# Patient Record
Sex: Male | Born: 1995 | Race: Black or African American | Hispanic: No | Marital: Single | Smoking: Former smoker
Health system: Southern US, Community
[De-identification: ages and names within clinical notes are randomized; demographics above are authoritative.]

## PROBLEM LIST (undated history)

## (undated) DIAGNOSIS — B2790 Infectious mononucleosis, unspecified without complication: Secondary | ICD-10-CM

## (undated) DIAGNOSIS — B20 Human immunodeficiency virus [HIV] disease: Secondary | ICD-10-CM

## (undated) DIAGNOSIS — A539 Syphilis, unspecified: Secondary | ICD-10-CM

## (undated) HISTORY — DX: Human immunodeficiency virus (HIV) disease: B20

## (undated) HISTORY — DX: Syphilis, unspecified: A53.9

---
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
## 2015-01-07 ENCOUNTER — Emergency Department (HOSPITAL_COMMUNITY)
Admission: EM | Admit: 2015-01-07 | Discharge: 2015-01-07 | Disposition: A | Discharge status: Home / Self Care | Payer: Medicaid Other | Attending: Emergency Medicine | Admitting: Emergency Medicine

## 2015-01-07 ENCOUNTER — Encounter (HOSPITAL_COMMUNITY): Payer: Self-pay | Admitting: Emergency Medicine

## 2015-01-07 DIAGNOSIS — H6691 Otitis media, unspecified, right ear: Secondary | ICD-10-CM | POA: Diagnosis not present

## 2015-01-07 DIAGNOSIS — H9191 Unspecified hearing loss, right ear: Secondary | ICD-10-CM | POA: Diagnosis not present

## 2015-01-07 DIAGNOSIS — B20 Human immunodeficiency virus [HIV] disease: Secondary | ICD-10-CM | POA: Diagnosis not present

## 2015-01-07 DIAGNOSIS — Z79899 Other long term (current) drug therapy: Secondary | ICD-10-CM | POA: Insufficient documentation

## 2015-01-07 DIAGNOSIS — Z87891 Personal history of nicotine dependence: Secondary | ICD-10-CM | POA: Diagnosis not present

## 2015-01-07 DIAGNOSIS — H9201 Otalgia, right ear: Secondary | ICD-10-CM | POA: Diagnosis present

## 2015-01-07 MED ORDER — AMOXICILLIN 500 MG PO CAPS: 500.0000 mg | ORAL_CAPSULE | Freq: Three times a day (TID) | ORAL | Status: DC

## 2015-01-07 MED ORDER — IBUPROFEN 800 MG PO TABS: 800.0000 mg | ORAL_TABLET | Freq: Three times a day (TID) | ORAL | Status: DC

## 2015-01-07 NOTE — ED Provider Notes (Signed)
CSN: [id number]     Arrival date & time 01/07/15  1503 History  This chart was scribed for non-physician practitioner, Langston Masker, PA-C, working with Mancel Bale, MD, by Ronney Lion, ED Scribe. This patient was seen in room TR06C/TR06C and the patient's care was started at 4:02 PM .   Chief Complaint  Patient presents with  . Ear Fullness   The history is provided by the patient. No language interpreter was used.    HPI Comments: Seth Taylor is a 19 y.o. male who presents to the Emergency Department complaining of constant, moderate, right otalgia and a sensation of fullness in his right ear. He also complains of associated hearing loss. He denies any recent swimming but states he has been taking a lot of showers lately. Applying pressure to the right side of his head alleviates his symptoms. Patient states he does not have a PCP. Patient has NKDA.  Past Medical History  Diagnosis Date  . Mononucleosis   . Syphilis   . HIV disease    History reviewed. No pertinent past surgical history. Family History  Problem Relation Age of Onset  . CVA Mother     seconday to drug use  . Diabetes Father   . Hepatitis C Father    History  Substance Use Topics  . Smoking status: Former Games developer  . Smokeless tobacco: Never Used  . Alcohol Use: 0.0 oz/week    0 Standard drinks or equivalent per week     Comment: "on occassion"    Review of Systems  HENT: Positive for congestion (sensation of ear congestion), ear pain and hearing loss.   All other systems reviewed and are negative.   Allergies  Review of patient's allergies indicates no known allergies.  Home Medications   Prior to Admission medications   Medication Sig Start Date End Date Taking? Authorizing Provider  elvitegravir-cobicistat-emtricitabine-tenofovir (GENVOYA) 150-150-200-10 MG TABS tablet Take 1 tablet by mouth daily with breakfast. 07/08/14  Yes Ginnie Smart, MD  ibuprofen (ADVIL,MOTRIN) 200 MG tablet Take 200 mg  by mouth every 6 (six) hours as needed for mild pain or moderate pain.   Yes Historical Provider, MD   BP 135/78 mmHg  Pulse 75  Temp(Src) 98.9 F (37.2 C) (Oral)  Resp 18  Ht  (1.803 m)  Wt 252 lb 6 oz (114.477 kg)  BMI 35.21 kg/m2  SpO2 97% Physical Exam  Constitutional: He is oriented to person, place, and time. He appears well-developed and well-nourished. No distress.  HENT:  Head: Normocephalic and atraumatic.  Right Ear: External ear and ear canal normal. Tympanic membrane is erythematous.  Left Ear: Hearing, tympanic membrane, external ear and ear canal normal.  Right TM is darkened and erythematous. Ear canal is normal.   Eyes: Conjunctivae and EOM are normal.  Neck: Neck supple. No tracheal deviation present.  Cardiovascular: Normal rate.   Pulmonary/Chest: Effort normal. No respiratory distress.  Musculoskeletal: Normal range of motion.  Neurological: He is alert and oriented to person, place, and time.  Skin: Skin is warm and dry.  Psychiatric: He has a normal mood and affect. His behavior is normal.  Nursing note and vitals reviewed.   ED Course  Procedures (including critical care time)  DIAGNOSTIC STUDIES: Oxygen Saturation is 97% on RA, normal by my interpretation.    COORDINATION OF CARE: 4:03 PM - Suspect otitis media. Discussed treatment plan with pt at bedside which includes Rx antibiotics for 1 week, and ibuprofen prn for pain. Advised  pt to return to ED in 1 week for persistent or worsening symptoms. Pt verbalized understanding and agreed to plan.   MDM   Final diagnoses:  Acute right otitis media, recurrence not specified, unspecified otitis media type    Meds ordered this encounter  Medications  . DISCONTD: ibuprofen (ADVIL,MOTRIN) 200 MG tablet    Sig: Take 200 mg by mouth every 6 (six) hours as needed for mild pain or moderate pain.  Marland Kitchen amoxicillin (AMOXIL) 500 MG capsule    Sig: Take 1 capsule (500 mg total) by mouth 3 (three) times  daily.    Dispense:  21 capsule    Refill:  0    Order Specific Question:  Supervising Provider    Answer:  MILLER, BRIAN [[id number]]  . ibuprofen (ADVIL,MOTRIN) 800 MG tablet    Sig: Take 1 tablet (800 mg total) by mouth 3 (three) times daily.    Dispense:  21 tablet    Refill:  0    Order Specific Question:  Supervising Provider    Answer:  Eber Hong [[id number]]    I personally performed the services in this documentation, which was scribed in my presence.  The recorded information has been reviewed and considered.   Barnet Pall.  Lonia Skinner Wilder, PA-C 01/07/15 1651  Mancel Bale, MD 01/07/15 [phone]

## 2015-01-07 NOTE — ED Notes (Signed)
Pt c/o not being able to hear out of right ear.  St's feels like water in it.

## 2015-01-07 NOTE — Discharge Instructions (Signed)

## 2015-01-07 NOTE — ED Notes (Signed)
Pt verbalizes understanding of d/c instructions and denies any further needs at this time. 

## 2015-01-24 ENCOUNTER — Ambulatory Visit: Payer: Self-pay | Admitting: Infectious Diseases

## 2015-01-31 ENCOUNTER — Ambulatory Visit: Payer: Self-pay | Admitting: Infectious Diseases

## 2015-03-15 ENCOUNTER — Ambulatory Visit: Payer: Medicaid Other | Admitting: Infectious Diseases

## 2015-05-11 ENCOUNTER — Encounter: Payer: Self-pay | Admitting: Infectious Diseases

## 2015-05-11 ENCOUNTER — Ambulatory Visit (INDEPENDENT_AMBULATORY_CARE_PROVIDER_SITE_OTHER): Payer: Medicaid Other | Admitting: Infectious Diseases

## 2015-05-11 VITALS — BP 125/73 | HR 68 | Temp 98.0°F | Wt 260.0 lb

## 2015-05-11 DIAGNOSIS — B20 Human immunodeficiency virus [HIV] disease: Secondary | ICD-10-CM

## 2015-05-11 DIAGNOSIS — Z113 Encounter for screening for infections with a predominantly sexual mode of transmission: Secondary | ICD-10-CM | POA: Diagnosis not present

## 2015-05-11 DIAGNOSIS — Z79899 Other long term (current) drug therapy: Secondary | ICD-10-CM | POA: Diagnosis not present

## 2015-05-11 DIAGNOSIS — A539 Syphilis, unspecified: Secondary | ICD-10-CM

## 2015-05-11 DIAGNOSIS — Z23 Encounter for immunization: Secondary | ICD-10-CM | POA: Diagnosis not present

## 2015-05-11 LAB — COMPREHENSIVE METABOLIC PANEL
ALBUMIN: 4.1 g/dL (ref 3.6–5.1)
ALK PHOS: 90 U/L (ref 48–230)
ALT: 17 U/L (ref 8–46)
AST: 20 U/L (ref 12–32)
BILIRUBIN TOTAL: 0.5 mg/dL (ref 0.2–1.1)
BUN: 12 mg/dL (ref 7–20)
CO2: 25 mmol/L (ref 20–31)
Calcium: 9.1 mg/dL (ref 8.9–10.4)
Chloride: 100 mmol/L (ref 98–110)
Creat: 0.94 mg/dL (ref 0.60–1.26)
GLUCOSE: 80 mg/dL (ref 65–99)
POTASSIUM: 3.7 mmol/L — AB (ref 3.8–5.1)
Sodium: 138 mmol/L (ref 135–146)
Total Protein: 7.1 g/dL (ref 6.3–8.2)

## 2015-05-11 LAB — CBC
HEMATOCRIT: 41.6 % (ref 39.0–52.0)
HEMOGLOBIN: 14.9 g/dL (ref 13.0–17.0)
MCH: 28.7 pg (ref 26.0–34.0)
MCHC: 35.8 g/dL (ref 30.0–36.0)
MCV: 80 fL (ref 78.0–100.0)
MPV: 9.8 fL (ref 8.6–12.4)
Platelets: 225 10*3/uL (ref 150–400)
RBC: 5.2 MIL/uL (ref 4.22–5.81)
RDW: 14.8 % (ref 11.5–15.5)
WBC: 6.3 10*3/uL (ref 4.0–10.5)

## 2015-05-11 NOTE — Addendum Note (Signed)
Addended by: Mariea ClontsGREEN, Tajuanna Burnett D on: 05/11/2015 01:59 PM   Modules accepted: Orders

## 2015-05-11 NOTE — Assessment & Plan Note (Signed)
Start HPV series Start Hep A and B Given condoms Will check his labs today Will get him in with housing Get him bus passes See him back in 4 months

## 2015-05-11 NOTE — Assessment & Plan Note (Signed)
Will recheck his RPR 

## 2015-05-11 NOTE — Addendum Note (Signed)
Addended by: Wendall MolaOCKERHAM, JACQUELINE A on: 05/11/2015 11:58 AM   Modules accepted: Orders

## 2015-05-11 NOTE — Progress Notes (Signed)
   Subjective:    Patient ID: Seth Taylor, male    DOB: 05/10/1996, 19 y.o.   MRN: [id number]  HPI 19 yo M with newly dx HIV+. 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 Had first visit 1--16 and was started on genvoya.   He has had issues with adherence since.  Is having difficulty with finances.   HIV 1 RNA QUANT (copies/mL)  Date Value  08/23/2014 <20  05/20/2014 [id number]*   CD4 T CELL ABS (/uL)  Date Value  08/23/2014 560  05/20/2014 380*    Review of Systems  Constitutional: Negative for appetite change and unexpected weight change.  Gastrointestinal: Negative for diarrhea and constipation.  Genitourinary: Negative for difficulty urinating.       Objective:   Physical Exam  Constitutional: He appears well-developed and well-nourished.  HENT:  Mouth/Throat: No oropharyngeal exudate.  Eyes: EOM are normal.  Neck: Neck supple.  Cardiovascular: Normal rate, regular rhythm and normal heart sounds.   Pulmonary/Chest: Effort normal and breath sounds normal.  Abdominal: Soft. Bowel sounds are normal. There is no tenderness. There is no rebound.  Musculoskeletal: He exhibits no edema.  Lymphadenopathy:    He has no cervical adenopathy.       Assessment & Plan:

## 2015-05-12 LAB — RPR TITER: RPR Titer: 1:16 {titer} — AB

## 2015-05-12 LAB — FLUORESCENT TREPONEMAL AB(FTA)-IGG-BLD: Fluorescent Treponemal ABS: REACTIVE — AB

## 2015-05-12 LAB — T-HELPER CELL (CD4) - (RCID CLINIC ONLY)
CD4 % Helper T Cell: 26 % — ABNORMAL LOW (ref 33–55)
CD4 T Cell Abs: 590 /uL (ref 400–2700)

## 2015-05-12 LAB — RPR: RPR Ser Ql: REACTIVE — AB

## 2015-05-16 LAB — HIV-1 RNA ULTRAQUANT REFLEX TO GENTYP+: HIV 1 RNA Quant: <20 copies/mL (ref <20)

## 2015-06-14 ENCOUNTER — Ambulatory Visit (INDEPENDENT_AMBULATORY_CARE_PROVIDER_SITE_OTHER): Payer: Medicaid Other | Admitting: *Deleted

## 2015-06-14 DIAGNOSIS — Z23 Encounter for immunization: Secondary | ICD-10-CM | POA: Diagnosis not present

## 2015-07-12 ENCOUNTER — Ambulatory Visit: Payer: Self-pay

## 2015-07-13 ENCOUNTER — Other Ambulatory Visit: Payer: Self-pay

## 2015-07-20 ENCOUNTER — Other Ambulatory Visit: Payer: Self-pay | Admitting: Infectious Diseases

## 2015-07-20 DIAGNOSIS — Z21 Asymptomatic human immunodeficiency virus [HIV] infection status: Secondary | ICD-10-CM

## 2015-07-20 DIAGNOSIS — B20 Human immunodeficiency virus [HIV] disease: Secondary | ICD-10-CM

## 2015-08-01 ENCOUNTER — Other Ambulatory Visit: Payer: Self-pay

## 2015-08-10 ENCOUNTER — Ambulatory Visit: Payer: Self-pay | Admitting: Infectious Diseases

## 2015-08-16 ENCOUNTER — Emergency Department (HOSPITAL_COMMUNITY): Payer: Medicaid Other

## 2015-08-16 ENCOUNTER — Encounter (HOSPITAL_COMMUNITY): Payer: Self-pay | Admitting: Emergency Medicine

## 2015-08-16 ENCOUNTER — Emergency Department (HOSPITAL_COMMUNITY)
Admission: EM | Admit: 2015-08-16 | Discharge: 2015-08-16 | Disposition: A | Discharge status: Home / Self Care | Payer: Medicaid Other | Attending: Emergency Medicine | Admitting: Emergency Medicine

## 2015-08-16 DIAGNOSIS — M545 Low back pain: Secondary | ICD-10-CM | POA: Insufficient documentation

## 2015-08-16 DIAGNOSIS — Z87891 Personal history of nicotine dependence: Secondary | ICD-10-CM | POA: Diagnosis not present

## 2015-08-16 DIAGNOSIS — J069 Acute upper respiratory infection, unspecified: Secondary | ICD-10-CM | POA: Insufficient documentation

## 2015-08-16 DIAGNOSIS — Z79899 Other long term (current) drug therapy: Secondary | ICD-10-CM | POA: Insufficient documentation

## 2015-08-16 DIAGNOSIS — R05 Cough: Secondary | ICD-10-CM | POA: Diagnosis present

## 2015-08-16 DIAGNOSIS — Z8619 Personal history of other infectious and parasitic diseases: Secondary | ICD-10-CM | POA: Diagnosis not present

## 2015-08-16 DIAGNOSIS — H00016 Hordeolum externum left eye, unspecified eyelid: Secondary | ICD-10-CM | POA: Diagnosis not present

## 2015-08-16 DIAGNOSIS — Z21 Asymptomatic human immunodeficiency virus [HIV] infection status: Secondary | ICD-10-CM | POA: Diagnosis not present

## 2015-08-16 LAB — BASIC METABOLIC PANEL
ANION GAP: 11 (ref 5–15)
BUN: 10 mg/dL (ref 6–20)
CHLORIDE: 100 mmol/L — AB (ref 101–111)
CO2: 26 mmol/L (ref 22–32)
Calcium: 9 mg/dL (ref 8.9–10.3)
Creatinine, Ser: 1.41 mg/dL — ABNORMAL HIGH (ref 0.61–1.24)
GFR calc non Af Amer: >60 mL/min (ref >60)
Glucose, Bld: 91 mg/dL (ref 65–99)
Potassium: 3.3 mmol/L — ABNORMAL LOW (ref 3.5–5.1)
Sodium: 137 mmol/L (ref 135–145)

## 2015-08-16 LAB — CBC
HEMATOCRIT: 45.6 % (ref 39.0–52.0)
HEMOGLOBIN: 15.4 g/dL (ref 13.0–17.0)
MCH: 27.6 pg (ref 26.0–34.0)
MCHC: 33.8 g/dL (ref 30.0–36.0)
MCV: 81.7 fL (ref 78.0–100.0)
Platelets: 195 10*3/uL (ref 150–400)
RBC: 5.58 MIL/uL (ref 4.22–5.81)
RDW: 14.6 % (ref 11.5–15.5)
WBC: 5.7 10*3/uL (ref 4.0–10.5)

## 2015-08-16 LAB — URINE MICROSCOPIC-ADD ON

## 2015-08-16 LAB — URINALYSIS, ROUTINE W REFLEX MICROSCOPIC
Bilirubin Urine: NEGATIVE
Glucose, UA: NEGATIVE mg/dL
Hgb urine dipstick: NEGATIVE
KETONES UR: NEGATIVE mg/dL
LEUKOCYTES UA: NEGATIVE
NITRITE: NEGATIVE
Protein, ur: 30 mg/dL — AB
SPECIFIC GRAVITY, URINE: 1.025 (ref 1.005–1.030)
pH: 5 (ref 5.0–8.0)

## 2015-08-16 LAB — RAPID STREP SCREEN (MED CTR MEBANE ONLY): Streptococcus, Group A Screen (Direct): NEGATIVE

## 2015-08-16 MED ORDER — KETOROLAC TROMETHAMINE 60 MG/2ML IM SOLN
60.0000 mg | Freq: Once | INTRAMUSCULAR | Status: DC
  Filled 2015-08-16: qty 2

## 2015-08-16 MED ORDER — AZITHROMYCIN 250 MG PO TABS: 250.0000 mg | ORAL_TABLET | Freq: Every day | ORAL | Status: DC

## 2015-08-16 NOTE — ED Notes (Signed)
Pt states "I got this stye in my eye for four weeks." Pt also c/o cough, and coughing up phlem. Pt also c/o L sided back pain. States "it hurts in my back when i bend over".

## 2015-08-16 NOTE — ED Notes (Signed)
Declined W/C at D/C and was escorted to lobby by RN. 

## 2015-08-16 NOTE — Discharge Instructions (Signed)
[name],  Nice meeting you! Please follow-up with your primary care provider. Your kidney function was decreased today. You need to follow-up with your primary care provider. Return to the emergency department if you do not improve within a few days. Feel better soon!  S. Lane HackerNicole Jeidi Gilles, PA-C Upper Respiratory Infection, Adult Most upper respiratory infections (URIs) are a viral infection of the air passages leading to the lungs. A URI affects the nose, throat, and upper air passages. The most common type of URI is nasopharyngitis and is typically referred to as "the common cold." URIs run their course and usually go away on their own. Most of the time, a URI does not require medical attention, but sometimes a bacterial infection in the upper airways can follow a viral infection. This is called a secondary infection. Sinus and middle ear infections are common types of secondary upper respiratory infections. Bacterial pneumonia can also complicate a URI. A URI can worsen asthma and chronic obstructive pulmonary disease (COPD). Sometimes, these complications can require emergency medical care and may be life threatening.  CAUSES Almost all URIs are caused by viruses. A virus is a type of germ and can spread from one person to another.  RISKS FACTORS You may be at risk for a URI if:   You smoke.   You have chronic heart or lung disease.  You have a weakened defense (immune) system.   You are very young or very old.   You have nasal allergies or asthma.  You work in crowded or poorly ventilated areas.  You work in health care facilities or schools. SIGNS AND SYMPTOMS  Symptoms typically develop 2-3 days after you come in contact with a cold virus. Most viral URIs last 7-10 days. However, viral URIs from the influenza virus (flu virus) can last 14-18 days and are typically more severe. Symptoms may include:   Runny or stuffy (congested) nose.   Sneezing.   Cough.    Sore throat.   Headache.   Fatigue.   Fever.   Loss of appetite.   Pain in your forehead, behind your eyes, and over your cheekbones (sinus pain).  Muscle aches.  DIAGNOSIS  Your health care provider may diagnose a URI by:  Physical exam.  Tests to check that your symptoms are not due to another condition such as:  Strep throat.  Sinusitis.  Pneumonia.  Asthma. TREATMENT  A URI goes away on its own with time. It cannot be cured with medicines, but medicines may be prescribed or recommended to relieve symptoms. Medicines may help:  Reduce your fever.  Reduce your cough.  Relieve nasal congestion. HOME CARE INSTRUCTIONS   Take medicines only as directed by your health care provider.   Gargle warm saltwater or take cough drops to comfort your throat as directed by your health care provider.  Use a warm mist humidifier or inhale steam from a shower to increase air moisture. This may make it easier to breathe.  Drink enough fluid to keep your urine clear or pale yellow.   Eat soups and other clear broths and maintain good nutrition.   Rest as needed.   Return to work when your temperature has returned to normal or as your health care provider advises. You may need to stay home longer to avoid infecting others. You can also use a face mask and careful hand washing to prevent spread of the virus.  Increase the usage of your inhaler if you have asthma.  Do not use any tobacco products, including cigarettes, chewing tobacco, or electronic cigarettes. If you need help quitting, ask your health care provider. PREVENTION  The best way to protect yourself from getting a cold is to practice good hygiene.   Avoid oral or hand contact with people with cold symptoms.   Wash your hands often if contact occurs.  There is no clear evidence that vitamin C, vitamin E, echinacea, or exercise reduces the chance of developing a cold. However, it is always  recommended to get plenty of rest, exercise, and practice good nutrition.  SEEK MEDICAL CARE IF:   You are getting worse rather than better.   Your symptoms are not controlled by medicine.   You have chills.  You have worsening shortness of breath.  You have brown or red mucus.  You have yellow or brown nasal discharge.  You have pain in your face, especially when you bend forward.  You have a fever.  You have swollen neck glands.  You have pain while swallowing.  You have white areas in the back of your throat. SEEK IMMEDIATE MEDICAL CARE IF:   You have severe or persistent:  Headache.  Ear pain.  Sinus pain.  Chest pain.  You have chronic lung disease and any of the following:  Wheezing.  Prolonged cough.  Coughing up blood.  A change in your usual mucus.  You have a stiff neck.  You have changes in your:  Vision.  Hearing.  Thinking.  Mood. MAKE SURE YOU:   Understand these instructions.  Will watch your condition.  Will get help right away if you are not doing well or get worse.   This information is not intended to replace advice given to you by your health care provider. Make sure you discuss any questions you have with your health care provider.   Document Released: 11/21/2000 Document Revised: 10/12/2014 Document Reviewed: 09/02/2013 Elsevier Interactive Patient Education [employer].

## 2015-08-16 NOTE — ED Provider Notes (Signed)
CSN: [id number]     Arrival date & time 08/16/15  1246 History  By signing my name below, I, Soijett Blue, attest that this documentation has been prepared under the direction and in the presence of S. Lane HackerNicole Emmalou Hunger, PA-C Electronically Signed: Soijett Blue, ED Scribe. 08/16/2015. 2:27 PM.    Chief Complaint  Patient presents with  . Cough  . Back Pain  . Stye      The history is provided by the patient. No language interpreter was used.    Seth Taylor is a 20 y.o. male with a PMHx of Mono, Syphilis, and HIV who presents to the Emergency Department complaining of productive cough onset 3 days. He states that he is having associated symptoms of chills, HA, and left lower back pain. Pt secondarily complains of a stye to his left eye x 1 month. He states that he has tried nyquil with no relief for his symptoms. He denies abdominal pain, difficulty urinating, and any other symptoms. Pt denies any PMHx of UTI or kidney stones.   Past Medical History  Diagnosis Date  . Mononucleosis   . Syphilis   . HIV disease (HCC)    History reviewed. No pertinent past surgical history. Family History  Problem Relation Age of Onset  . CVA Mother     seconday to drug use  . Diabetes Father   . Hepatitis C Father    Social History  Substance Use Topics  . Smoking status: Former Games developermoker  . Smokeless tobacco: Never Used  . Alcohol Use: 0.0 oz/week    0 Standard drinks or equivalent per week     Comment: "on occassion"    Review of Systems  Eyes:       Stye to left eye  Respiratory: Positive for cough.   Gastrointestinal: Negative for abdominal pain.  Genitourinary: Negative for difficulty urinating.  Musculoskeletal: Positive for back pain. Negative for gait problem.      Allergies  Review of patient's allergies indicates no known allergies.  Home Medications   Prior to Admission medications   Medication Sig Start Date End Date Taking? Authorizing Provider  GENVOYA 150-150-200-10  MG TABS tablet TAKE 1 TABLET BY MOUTH DAILY WITH BREAKFAST. 07/20/15   Ginnie SmartJeffrey C Hatcher, MD   BP 123/79 mmHg  Pulse 90  Temp(Src) 99.3 F (37.4 C)  Resp 18  Ht 5\' 11"  (1.803 m)  Wt 265 lb 12.8 oz (120.566 kg)  BMI 37.09 kg/m2  SpO2 96% Physical Exam  Constitutional: He is oriented to person, place, and time. He appears well-developed and well-nourished. No distress.  HENT:  Head: Normocephalic and atraumatic.  Mouth/Throat: Posterior oropharyngeal edema and posterior oropharyngeal erythema present.  Tonsils are 3+ with erythema  Eyes: EOM are normal.  Neck: Neck supple.  Cardiovascular: Normal rate, regular rhythm and normal heart sounds.  Exam reveals no gallop and no friction rub.   No murmur heard. Pulmonary/Chest: Effort normal and breath sounds normal. No respiratory distress. He has no wheezes. He has no rales.  Musculoskeletal: Normal range of motion.  Neurological: He is alert and oriented to person, place, and time.  Skin: Skin is warm and dry.  Psychiatric: He has a normal mood and affect. His behavior is normal.  Nursing note and vitals reviewed.   ED Course  Procedures  DIAGNOSTIC STUDIES: Oxygen Saturation is 96% on RA, nl by my interpretation.    COORDINATION OF CARE: 2:24 PM Discussed treatment plan with pt at bedside which includes CXR, labs,  rapid strep screen, abx, and pt agreed to plan.   Labs Review Labs Reviewed  BASIC METABOLIC PANEL - Abnormal; Notable for the following:    Potassium 3.3 (*)    Chloride 100 (*)    Creatinine, Ser 1.41 (*)    All other components within normal limits  URINALYSIS, ROUTINE W REFLEX MICROSCOPIC (NOT AT [facility]) - Abnormal; Notable for the following:    Color, Urine AMBER (*)    APPearance CLOUDY (*)    Protein, ur 30 (*)    All other components within normal limits  URINE MICROSCOPIC-ADD ON - Abnormal; Notable for the following:    Squamous Epithelial / LPF 0-5 (*)    Bacteria, UA FEW (*)    Casts GRANULAR CAST (*)     All other components within normal limits  RAPID STREP SCREEN (NOT AT [facility])  CULTURE, GROUP A STREP [facility])  CBC    Imaging Review Dg Chest 2 View  08/16/2015  CLINICAL DATA:  20 year old with cough and fever for 3 or 4 days. Smoker. EXAM: CHEST  2 VIEW COMPARISON:  07/07/2013 and 06/21/2013. FINDINGS: The heart size and mediastinal contours are normal. The lungs are clear. There is no pleural effusion or pneumothorax. No acute osseous findings are identified. IMPRESSION: Stable chest.  No active cardiopulmonary process. Electronically Signed   By: Carey Bullocks M.D.   On: 08/16/2015 14:52   I have personally reviewed and evaluated these images and lab results as part of my medical decision-making.  MDM   Final diagnoses:  URI (upper respiratory infection)   Patient nontoxic appearing, vital signs stable. Obtain basic labs and chest x-ray due to patient's comorbidities. Urinalysis with 30 proteinuria and granular casts. CBC unremarkable. BMP with elevated creatinine of 1.41. Chest x-ray unremarkable. Patients symptoms are consistent with URI, likely viral etiology; however, given patient's comorbidities, will empirically treat for bacterial etiology. Pt will be discharged with symptomatic treatment.  Verbalizes understanding and is agreeable with plan. Pt is hemodynamically stable & in NAD prior to dc.   I personally performed the services described in this documentation, which was scribed in my presence. The recorded information has been reviewed and is accurate.   Melton Krebs, PA-C 08/19/15 1610  Alvira Monday, MD 08/20/15 [phone]

## 2015-08-17 ENCOUNTER — Telehealth: Payer: Self-pay | Admitting: *Deleted

## 2015-08-17 NOTE — Telephone Encounter (Signed)
April ok

## 2015-08-17 NOTE — Telephone Encounter (Signed)
So is it ok to wait until April to see you or does he need a sooner appt?

## 2015-08-17 NOTE — Telephone Encounter (Signed)
Patient called to advise he went to the ED 08/16/15 and was told there he needed to check with us to see if his Darrin LuisGenvoya could be affecting his kidney function. He was severely dehydrated and they think it is because of the medication he takes from us. Advised him not sure Darrin LuisGenvoya is the cause since he admits he does not drink water daily but will ask the doctor and someone will give him a call back. Also gave him an appointment in April to see his doctor.

## 2015-08-17 NOTE — Telephone Encounter (Signed)
It could. That is too high of a jump than i would expect from the cobi but not outside of the realm of possible.

## 2015-08-18 LAB — CULTURE, GROUP A STREP (THRC)

## 2015-09-13 ENCOUNTER — Ambulatory Visit: Payer: Self-pay | Admitting: Infectious Diseases

## 2015-10-18 ENCOUNTER — Encounter (HOSPITAL_COMMUNITY): Payer: Self-pay

## 2015-10-18 ENCOUNTER — Emergency Department (HOSPITAL_COMMUNITY)
Admission: EM | Admit: 2015-10-18 | Discharge: 2015-10-18 | Disposition: A | Discharge status: Home / Self Care | Payer: Medicaid Other | Attending: Emergency Medicine | Admitting: Emergency Medicine

## 2015-10-18 DIAGNOSIS — Z791 Long term (current) use of non-steroidal anti-inflammatories (NSAID): Secondary | ICD-10-CM | POA: Diagnosis not present

## 2015-10-18 DIAGNOSIS — Z87891 Personal history of nicotine dependence: Secondary | ICD-10-CM | POA: Diagnosis not present

## 2015-10-18 DIAGNOSIS — R197 Diarrhea, unspecified: Secondary | ICD-10-CM | POA: Diagnosis not present

## 2015-10-18 DIAGNOSIS — R109 Unspecified abdominal pain: Secondary | ICD-10-CM | POA: Diagnosis not present

## 2015-10-18 DIAGNOSIS — R112 Nausea with vomiting, unspecified: Secondary | ICD-10-CM | POA: Insufficient documentation

## 2015-10-18 LAB — CBC
HEMATOCRIT: 48.8 % (ref 39.0–52.0)
HEMOGLOBIN: 17 g/dL (ref 13.0–17.0)
MCH: 28.7 pg (ref 26.0–34.0)
MCHC: 34.8 g/dL (ref 30.0–36.0)
MCV: 82.3 fL (ref 78.0–100.0)
Platelets: 244 10*3/uL (ref 150–400)
RBC: 5.93 MIL/uL — ABNORMAL HIGH (ref 4.22–5.81)
RDW: 13.9 % (ref 11.5–15.5)
WBC: 9.1 10*3/uL (ref 4.0–10.5)

## 2015-10-18 LAB — COMPREHENSIVE METABOLIC PANEL
ALT: 38 U/L (ref 17–63)
ANION GAP: 15 (ref 5–15)
AST: 44 U/L — ABNORMAL HIGH (ref 15–41)
Albumin: 4.4 g/dL (ref 3.5–5.0)
Alkaline Phosphatase: 93 U/L (ref 38–126)
BUN: 11 mg/dL (ref 6–20)
CHLORIDE: 104 mmol/L (ref 101–111)
CO2: 21 mmol/L — ABNORMAL LOW (ref 22–32)
Calcium: 9.4 mg/dL (ref 8.9–10.3)
Creatinine, Ser: 1.05 mg/dL (ref 0.61–1.24)
Glucose, Bld: 112 mg/dL — ABNORMAL HIGH (ref 65–99)
Potassium: 4.2 mmol/L (ref 3.5–5.1)
SODIUM: 140 mmol/L (ref 135–145)
TOTAL PROTEIN: 7.9 g/dL (ref 6.5–8.1)
Total Bilirubin: 1.5 mg/dL — ABNORMAL HIGH (ref 0.3–1.2)

## 2015-10-18 LAB — URINE MICROSCOPIC-ADD ON

## 2015-10-18 LAB — URINALYSIS, ROUTINE W REFLEX MICROSCOPIC
GLUCOSE, UA: NEGATIVE mg/dL
Hgb urine dipstick: NEGATIVE
Ketones, ur: 15 mg/dL — AB
LEUKOCYTES UA: NEGATIVE
NITRITE: NEGATIVE
PH: 5.5 (ref 5.0–8.0)
Protein, ur: 30 mg/dL — AB
SPECIFIC GRAVITY, URINE: 1.034 — AB (ref 1.005–1.030)

## 2015-10-18 LAB — LIPASE, BLOOD: LIPASE: 19 U/L (ref 11–51)

## 2015-10-18 MED ORDER — ONDANSETRON HCL 4 MG/2ML IJ SOLN
4.0000 mg | Freq: Once | INTRAMUSCULAR | Status: AC | PRN
  Administered 2015-10-18: 4 mg via INTRAVENOUS
  Filled 2015-10-18: qty 2

## 2015-10-18 MED ORDER — ONDANSETRON 4 MG PO TBDP: 4.0000 mg | ORAL_TABLET | Freq: Three times a day (TID) | ORAL | Status: DC | PRN

## 2015-10-18 MED ORDER — SODIUM CHLORIDE 0.9 % IV BOLUS (SEPSIS)
1000.0000 mL | Freq: Once | INTRAVENOUS | Status: AC
  Administered 2015-10-18: 1000 mL via INTRAVENOUS

## 2015-10-18 MED ORDER — ONDANSETRON HCL 4 MG/2ML IJ SOLN
4.0000 mg | Freq: Once | INTRAMUSCULAR | Status: AC
  Administered 2015-10-18: 4 mg via INTRAVENOUS
  Filled 2015-10-18: qty 2

## 2015-10-18 NOTE — ED Provider Notes (Signed)
CSN: [id number]     Arrival date & time 10/18/15  [id number] History   First MD Initiated Contact with Patient 10/18/15 [phone]     Chief Complaint  Patient presents with  . Abdominal Pain  . Emesis   (Consider location/radiation/quality/duration/timing/severity/associated sxs/prior Treatment) HPI Comments: Patient with HIV on ART, compliant, CD4 04/2015 on in 500's -- presents with 1 day history of N/V and one episode of loose stool last night. Patient points of occasional epigastric pain. No history of abdominal surgeries. No fever. Stool is poorly formed and nonbloody. No urine symptoms. Patient denies heavy NSAID use. He last drank alcohol 3 nights ago. Denies suspicious food and water exposure. No travel or known sick contacts. The onset of this condition was acute. The course is constant. Aggravating factors: none. Alleviating factors: none.    Patient is a 20 y.o. male presenting with abdominal pain and vomiting. The history is provided by the patient and medical records.  Abdominal Pain Associated symptoms: diarrhea, nausea and vomiting   Associated symptoms: no chest pain, no cough, no dysuria, no fever and no sore throat   Emesis Associated symptoms: abdominal pain and diarrhea   Associated symptoms: no headaches, no myalgias and no sore throat     Past Medical History  Diagnosis Date  . Mononucleosis   . Syphilis   . HIV disease (HCC)    History reviewed. No pertinent past surgical history. Family History  Problem Relation Age of Onset  . CVA Mother     seconday to drug use  . Diabetes Father   . Hepatitis C Father    Social History  Substance Use Topics  . Smoking status: Former Games developermoker  . Smokeless tobacco: Never Used  . Alcohol Use: 0.0 oz/week    0 Standard drinks or equivalent per week     Comment: "on occassion"    Review of Systems  Constitutional: Negative for fever.  HENT: Negative for rhinorrhea and sore throat.   Eyes: Negative for redness.  Respiratory:  Negative for cough.   Cardiovascular: Negative for chest pain.  Gastrointestinal: Positive for nausea, vomiting, abdominal pain and diarrhea. Negative for blood in stool.  Genitourinary: Negative for dysuria.  Musculoskeletal: Negative for myalgias.  Skin: Negative for rash.  Neurological: Negative for headaches.    Allergies  Review of patient's allergies indicates no known allergies.  Home Medications   Prior to Admission medications   Medication Sig Start Date End Date Taking? Authorizing Provider  azithromycin (ZITHROMAX) 250 MG tablet Take 1 tablet (250 mg total) by mouth daily. Take first 2 tablets together, then 1 every day until finished. 08/16/15   Samantha Lane HackerNicole Riley, PA-C  GENVOYA 150-150-200-10 MG TABS tablet TAKE 1 TABLET BY MOUTH DAILY WITH BREAKFAST. 07/20/15   Ginnie SmartJeffrey C Hatcher, MD   BP 139/102 mmHg  Pulse 92  Temp(Src) 98.8 F (37.1 C)  Resp 14  SpO2 96%   Physical Exam  Constitutional: He appears well-developed and well-nourished.  HENT:  Head: Normocephalic and atraumatic.  Eyes: Conjunctivae are normal. Right eye exhibits no discharge. Left eye exhibits no discharge.  Neck: Normal range of motion. Neck supple.  Cardiovascular: Normal rate, regular rhythm and normal heart sounds.   Pulmonary/Chest: Effort normal and breath sounds normal. No respiratory distress. He has no wheezes. He has no rales.  Abdominal: Soft. Bowel sounds are normal. He exhibits no distension. There is no tenderness. There is no rebound and no guarding.  Neurological: He is alert.  Skin: Skin is  warm and dry.  Psychiatric: He has a normal mood and affect.  Nursing note and vitals reviewed.   ED Course  Procedures (including critical care time) Labs Review Labs Reviewed  COMPREHENSIVE METABOLIC PANEL - Abnormal; Notable for the following:    CO2 21 (*)    Glucose, Bld 112 (*)    AST 44 (*)    Total Bilirubin 1.5 (*)    All other components within normal limits  CBC - Abnormal;  Notable for the following:    RBC 5.93 (*)    All other components within normal limits  URINALYSIS, ROUTINE W REFLEX MICROSCOPIC (NOT AT [facility]) - Abnormal; Notable for the following:    APPearance HAZY (*)    Specific Gravity, Urine 1.034 (*)    Bilirubin Urine SMALL (*)    Ketones, ur 15 (*)    Protein, ur 30 (*)    All other components within normal limits  URINE MICROSCOPIC-ADD ON - Abnormal; Notable for the following:    Squamous Epithelial / LPF 0-5 (*)    Bacteria, UA FEW (*)    All other components within normal limits  LIPASE, BLOOD    8:04 AM Patient seen and examined. Work-up initiated. Medications ordered.   Vital signs reviewed and are as follows: BP 139/102 mmHg  Pulse 92  Temp(Src) 98.8 F (37.1 C)  Resp 14  SpO2 96%  11:47 AM Patient stable, now tolerating PO's. Abdomen remains soft and nontender. Will d/c to home with zofran.   Patient urged to return with worsening symptoms or other concerns. Patient verbalized understanding and agrees with plan.    MDM   Final diagnoses:  Non-intractable vomiting with nausea, vomiting of unspecified type   Patient with N/V, loose stool. Vitals are stable, no fever. No signs of significant dehydration, tolerating PO's. Lungs are clear. No focal abdominal pain. Low concern for appendicitis, cholecystitis, pancreatitis, ruptured viscus, UTI, kidney stone, aortic dissection, aortic aneurysm or other emergent abdominal etiology. Pt does have a h/o HIV with normal CD4 and states he is compliant. I do not feel symptoms represent IRIS given ongoing ART. Supportive therapy indicated with return if symptoms worsen. Patient counseled.     Renne Crigler, PA-C 10/18/15 1149  Marily Memos, MD 10/19/15 1057

## 2015-10-18 NOTE — ED Notes (Signed)
Pt unable to obtain urine specimen at this time 

## 2015-10-18 NOTE — ED Notes (Signed)
Per [facility] - pt from home. Pt reports vomiting since yesterday. Pt still actively vomiting. Pt thought he saw some blood in vomit. Vomit does not appear bloody at this time. Pt c/o mid abd pain and nausea at this time. 1 episode diarrhea.

## 2015-10-18 NOTE — Discharge Instructions (Signed)
Please read and follow all provided instructions.  Your diagnoses today include:  1. Non-intractable vomiting with nausea, vomiting of unspecified type    Tests performed today include:  Blood counts and electrolytes  Blood tests to check liver and kidney function  Blood tests to check pancreas function  Urine test to look for infection - no infection, mild dehydration  Vital signs. See below for your results today.   Medications prescribed:   Zofran (ondansetron) - for nausea and vomiting  Take any prescribed medications only as directed.  Home care instructions:   Follow any educational materials contained in this packet.   Your abdominal pain, nausea, vomiting, and diarrhea may be caused by a viral gastroenteritis also called 'stomach flu'. You should rest for the next several days. Keep drinking plenty of fluids and use the medicine for nausea as directed.    Drink clear liquids for the next 24 hours and introduce solid foods slowly after 24 hours using the b.r.a.t. diet (Bananas, Rice, Applesauce, Toast, Yogurt).    Follow-up instructions: Please follow-up with your primary care provider in the next 2 days for further evaluation of your symptoms. If you are not feeling better in 48 hours you may have a condition that is more serious and you need re-evaluation.   Return instructions:  SEEK IMMEDIATE MEDICAL ATTENTION IF:  If you have pain that does not go away or becomes severe   A temperature above 101F develops   Repeated vomiting occurs (multiple episodes)   If you have pain that becomes localized to portions of the abdomen. The right side could possibly be appendicitis. In an adult, the left lower portion of the abdomen could be colitis or diverticulitis.   Blood is being passed in stools or vomit (bright red or black tarry stools)   You develop chest pain, difficulty breathing, dizziness or fainting, or become confused, poorly responsive, or inconsolable (young  children)  If you have any other emergent concerns regarding your health  Additional Information: Abdominal (belly) pain can be caused by many things. Your caregiver performed an examination and possibly ordered blood/urine tests and imaging (CT scan, x-rays, ultrasound). Many cases can be observed and treated at home after initial evaluation in the emergency department. Even though you are being discharged home, abdominal pain can be unpredictable. Therefore, you need a repeated exam if your pain does not resolve, returns, or worsens. Most patients with abdominal pain don't have to be admitted to the hospital or have surgery, but serious problems like appendicitis and gallbladder attacks can start out as nonspecific pain. Many abdominal conditions cannot be diagnosed in one visit, so follow-up evaluations are very important.  Your vital signs today were: BP 108/49 mmHg   Pulse 98   Temp(Src) 98.8 F (37.1 C)   Resp 16   SpO2 95% If your blood pressure (bp) was elevated above 135/85 this visit, please have this repeated by your doctor within one month. --------------

## 2015-10-18 NOTE — ED Notes (Signed)
Pt given ginger ale. Pt tolerated PO fluids w/o difficulty.

## 2015-10-18 NOTE — ED Notes (Signed)
Pt reminded again of need for urine specimen. Pt states he will try to obtain at this time.

## 2015-10-18 NOTE — ED Notes (Signed)
Pt reminded of need for urine specimen 

## 2015-10-18 NOTE — ED Notes (Signed)
Pt verbalized understanding of d/c instructions, prescriptions, and follow-up care. No further questions/concerns, VSS, ambulatory w/ steady gait (refused wheelchair) 

## 2015-11-03 ENCOUNTER — Encounter: Payer: Self-pay | Admitting: Infectious Diseases

## 2015-11-03 ENCOUNTER — Ambulatory Visit (INDEPENDENT_AMBULATORY_CARE_PROVIDER_SITE_OTHER): Payer: Medicaid Other | Admitting: Infectious Diseases

## 2015-11-03 VITALS — BP 130/75 | HR 69 | Temp 98.0°F | Ht 71.0 in | Wt 263.0 lb

## 2015-11-03 DIAGNOSIS — B20 Human immunodeficiency virus [HIV] disease: Secondary | ICD-10-CM

## 2015-11-03 DIAGNOSIS — E781 Pure hyperglyceridemia: Secondary | ICD-10-CM | POA: Diagnosis not present

## 2015-11-03 DIAGNOSIS — Z113 Encounter for screening for infections with a predominantly sexual mode of transmission: Secondary | ICD-10-CM | POA: Diagnosis not present

## 2015-11-03 DIAGNOSIS — A539 Syphilis, unspecified: Secondary | ICD-10-CM

## 2015-11-03 LAB — CBC
HEMATOCRIT: 40.6 % (ref 38.5–50.0)
HEMOGLOBIN: 14 g/dL (ref 13.2–17.1)
MCH: 27.8 pg (ref 27.0–33.0)
MCHC: 34.5 g/dL (ref 32.0–36.0)
MCV: 80.6 fL (ref 80.0–100.0)
MPV: 9.4 fL (ref 7.5–12.5)
Platelets: 257 10*3/uL (ref 140–400)
RBC: 5.04 MIL/uL (ref 4.20–5.80)
RDW: 14.3 % (ref 11.0–15.0)
WBC: 4.8 10*3/uL (ref 3.8–10.8)

## 2015-11-03 LAB — LIPID PANEL
CHOL/HDL RATIO: 3.8 ratio (ref <=5.0)
Cholesterol: 180 mg/dL — ABNORMAL HIGH (ref 125–170)
HDL: 48 mg/dL (ref >=40)
LDL Cholesterol: 87 mg/dL (ref <110)
TRIGLYCERIDES: 226 mg/dL — AB (ref <150)
VLDL: 45 mg/dL — ABNORMAL HIGH (ref <30)

## 2015-11-03 LAB — COMPREHENSIVE METABOLIC PANEL
ALBUMIN: 3.7 g/dL (ref 3.6–5.1)
ALK PHOS: 71 U/L (ref 40–115)
ALT: 58 U/L — AB (ref 9–46)
AST: 34 U/L (ref 10–40)
BILIRUBIN TOTAL: 0.5 mg/dL (ref 0.2–1.2)
BUN: 7 mg/dL (ref 7–25)
CALCIUM: 9 mg/dL (ref 8.6–10.3)
CO2: 29 mmol/L (ref 20–31)
Chloride: 102 mmol/L (ref 98–110)
Creat: 0.86 mg/dL (ref 0.60–1.35)
Glucose, Bld: 74 mg/dL (ref 65–99)
POTASSIUM: 3.4 mmol/L — AB (ref 3.5–5.3)
Sodium: 140 mmol/L (ref 135–146)
Total Protein: 6.7 g/dL (ref 6.1–8.1)

## 2015-11-03 NOTE — Assessment & Plan Note (Signed)
Will recheck RPR today.

## 2015-11-03 NOTE — Assessment & Plan Note (Signed)
He is doing well Will recheck his labs Given condoms today.  Will see him back in 6 months.

## 2015-11-03 NOTE — Assessment & Plan Note (Signed)
Check lipids today 

## 2015-11-03 NOTE — Progress Notes (Signed)
   Subjective:    Patient ID: Seth Taylor, male    DOB: 02/06/1996, 20 y.o.   MRN: [id number]  HPI 20 yo M with HIV+ (and syphilis) dx with STI screening 06-2014. He was started on genvoya.  Was seen in ED for back pain. Was told he had worsening of his CrCl. It has since normalized. Has been drinking more water since then.   HIV 1 RNA QUANT (copies/mL)  Date Value  05/11/2015 <20  08/23/2014 <20  05/20/2014 [id number]*   CD4 T CELL ABS (/uL)  Date Value  05/11/2015 590  08/23/2014 560  05/20/2014 380*    States he takes his ART, has some timing irregularity.  Recently had stomach virus.   Review of Systems  Constitutional: Negative for fever, chills, appetite change and unexpected weight change.  Gastrointestinal: Negative for diarrhea and constipation.  Genitourinary: Negative for hematuria and difficulty urinating.       Objective:   Physical Exam  Constitutional: He appears well-developed and well-nourished.  HENT:  Mouth/Throat: No oropharyngeal exudate.  Eyes: EOM are normal. Pupils are equal, round, and reactive to light.  Neck: Neck supple.  Cardiovascular: Normal rate, regular rhythm and normal heart sounds.   Pulmonary/Chest: Effort normal and breath sounds normal.  Abdominal: Soft. Bowel sounds are normal. There is no tenderness. There is no rebound.  Musculoskeletal: He exhibits no edema.  Lymphadenopathy:    He has no cervical adenopathy.          Assessment & Plan:

## 2015-11-04 LAB — T-HELPER CELL (CD4) - (RCID CLINIC ONLY)
CD4 T CELL ABS: 660 /uL (ref 400–2700)
CD4 T CELL HELPER: 29 % — AB (ref 33–55)

## 2015-11-04 LAB — RPR TITER: RPR Titer: 1:8 {titer}

## 2015-11-04 LAB — HIV-1 RNA QUANT-NO REFLEX-BLD
HIV 1 RNA Quant: <20 copies/mL (ref <20)
HIV-1 RNA Quant, Log: <1.3 Log copies/mL (ref <1.30)

## 2015-11-04 LAB — RPR: RPR Ser Ql: REACTIVE — AB

## 2015-11-04 LAB — FLUORESCENT TREPONEMAL AB(FTA)-IGG-BLD: FLUORESCENT TREPONEMAL ABS: REACTIVE — AB

## 2016-01-15 ENCOUNTER — Other Ambulatory Visit: Payer: Self-pay | Admitting: Infectious Diseases

## 2016-01-15 DIAGNOSIS — B20 Human immunodeficiency virus [HIV] disease: Secondary | ICD-10-CM

## 2016-04-16 ENCOUNTER — Other Ambulatory Visit: Payer: Self-pay

## 2016-04-30 ENCOUNTER — Ambulatory Visit: Payer: Self-pay | Admitting: Infectious Diseases

## 2016-07-17 ENCOUNTER — Telehealth: Payer: Self-pay

## 2016-07-17 NOTE — Telephone Encounter (Signed)
[facility] Seth Taylor is a patient with [facility]. He is currently in Detention and without medications.  Patient stated he was taking Genvoya.  Left message for Seth Taylor to call our office for more information or to schedule appointment.    [phone]    Seth Josephsammy K Bonnee Zertuche, RN

## 2016-07-30 ENCOUNTER — Encounter: Payer: Self-pay | Admitting: Infectious Diseases

## 2016-08-08 ENCOUNTER — Encounter (HOSPITAL_COMMUNITY): Payer: Self-pay | Admitting: Emergency Medicine

## 2016-08-08 ENCOUNTER — Emergency Department (HOSPITAL_COMMUNITY)
Admission: EM | Admit: 2016-08-08 | Discharge: 2016-08-08 | Disposition: A | Discharge status: Home / Self Care | Payer: Medicaid Other | Attending: Emergency Medicine | Admitting: Emergency Medicine

## 2016-08-08 DIAGNOSIS — N341 Nonspecific urethritis: Secondary | ICD-10-CM | POA: Insufficient documentation

## 2016-08-08 DIAGNOSIS — Z79899 Other long term (current) drug therapy: Secondary | ICD-10-CM | POA: Diagnosis not present

## 2016-08-08 DIAGNOSIS — N342 Other urethritis: Secondary | ICD-10-CM

## 2016-08-08 DIAGNOSIS — R369 Urethral discharge, unspecified: Secondary | ICD-10-CM | POA: Diagnosis present

## 2016-08-08 LAB — URINALYSIS, ROUTINE W REFLEX MICROSCOPIC
BILIRUBIN URINE: NEGATIVE
GLUCOSE, UA: NEGATIVE mg/dL
HGB URINE DIPSTICK: NEGATIVE
KETONES UR: NEGATIVE mg/dL
NITRITE: NEGATIVE
PH: 5 (ref 5.0–8.0)
Protein, ur: NEGATIVE mg/dL
SPECIFIC GRAVITY, URINE: 1.023 (ref 1.005–1.030)

## 2016-08-08 MED ORDER — CEFTRIAXONE SODIUM 250 MG IJ SOLR
250.0000 mg | Freq: Once | INTRAMUSCULAR | Status: AC
  Administered 2016-08-08: 250 mg via INTRAMUSCULAR
  Filled 2016-08-08: qty 250

## 2016-08-08 MED ORDER — LIDOCAINE HCL (PF) 1 % IJ SOLN
INTRAMUSCULAR | Status: AC
  Administered 2016-08-08: 5 mL
  Filled 2016-08-08: qty 5

## 2016-08-08 MED ORDER — AZITHROMYCIN 250 MG PO TABS
1000.0000 mg | ORAL_TABLET | Freq: Once | ORAL | Status: AC
  Administered 2016-08-08: 1000 mg via ORAL
  Filled 2016-08-08: qty 4

## 2016-08-08 NOTE — ED Triage Notes (Signed)
Pt presents with penile discharge and penile pain x 2 days; pt states protected sex 3 days ago but "i think the condom popped"; pt stating that the tip of penis is sensitive and there is discharge in my underwear;

## 2016-08-08 NOTE — ED Provider Notes (Signed)
[facility]-EMERGENCY DEPT Provider Note   CSN: [id number] Arrival date & time: 08/08/16  2130     History   Chief Complaint Chief Complaint  Patient presents with  . Exposure to STD  . Penile Discharge    HPI   Blood pressure 122/78, pulse 109, temperature 98.1 F (36.7 C), temperature source Oral, resp. rate 18, height 5\' 11"  (1.803 m), weight 124.7 kg, SpO2 96 %.  Seth Taylor is a 21 y.o. male with past medical history significant for HIV (he states he's compliant with his antiretrovirals, chart review shows last RNA undetectable in size of 2017) states he follows with Dr. Ninetta Lights. He is complaining of urethral discharge and irritation to the tip of the penis over the course of 3 days he states he had sex 3 days ago but the condom broke. He denies abdominal pain, rash/lesion, testicular pain or swelling. On review of systems he endorses a mild dysuria.  Past Medical History:  Diagnosis Date  . HIV disease (HCC)   . Mononucleosis   . Syphilis     Patient Active Problem List   Diagnosis Date Noted  . HIV disease (HCC) 07/08/2014  . Syphilis 07/08/2014  . Gonorrhea 07/08/2014  . Hypertriglyceridemia 07/08/2014    History reviewed. No pertinent surgical history.     Home Medications    Prior to Admission medications   Medication Sig Start Date End Date Taking? Authorizing Provider  elvitegravir-cobicistat-emtricitabine-tenofovir (GENVOYA) 150-150-200-10 MG TABS tablet Take 1 tablet by mouth daily with breakfast. 01/16/16   Ginnie Smart, MD    Family History Family History  Problem Relation Age of Onset  . CVA Mother     seconday to drug use  . Diabetes Father   . Hepatitis C Father     Social History Social History  Substance Use Topics  . Smoking status: Former Games developer  . Smokeless tobacco: Never Used  . Alcohol use 0.0 oz/week     Comment: "on occassion"     Allergies   Patient has no known allergies.   Review of Systems Review of  Systems  10 systems reviewed and found to be negative, except as noted in the HPI.   Physical Exam Updated Vital Signs BP 122/78 (BP Location: Right Arm)   Pulse 109   Temp 98.1 F (36.7 C) (Oral)   Resp 18   Ht 5\' 11"  (1.803 m)   Wt 124.7 kg   SpO2 96%   BMI 38.35 kg/m   Physical Exam  Constitutional: He is oriented to person, place, and time. He appears well-developed and well-nourished. No distress.  HENT:  Head: Normocephalic and atraumatic.  Mouth/Throat: Oropharynx is clear and moist.  Eyes: Conjunctivae and EOM are normal. Pupils are equal, round, and reactive to light.  Neck: Normal range of motion.  Cardiovascular: Normal rate, regular rhythm and intact distal pulses.   Pulmonary/Chest: Effort normal and breath sounds normal.  Abdominal: Soft. There is no tenderness.  Genitourinary:  Genitourinary Comments: Circumcised penis, scant urethral discharge. No testicular pain or swelling  Musculoskeletal: Normal range of motion.  Neurological: He is alert and oriented to person, place, and time.  Skin: He is not diaphoretic.  Psychiatric: He has a normal mood and affect.  Nursing note and vitals reviewed.    ED Treatments / Results  Labs (all labs ordered are listed, but only abnormal results are displayed) Labs Reviewed  URINALYSIS, ROUTINE W REFLEX MICROSCOPIC - Abnormal; Notable for the following:  Result Value   Leukocytes, UA SMALL (*)    Bacteria, UA RARE (*)    Squamous Epithelial / LPF 0-5 (*)    All other components within normal limits  RPR  GC/CHLAMYDIA PROBE AMP ([place]) NOT AT [facility]    EKG  EKG Interpretation None       Radiology No results found.  Procedures Procedures (including critical care time)  Medications Ordered in ED Medications  cefTRIAXone (ROCEPHIN) injection 250 mg (not administered)  azithromycin (ZITHROMAX) tablet 1,000 mg (not administered)  lidocaine (PF) (XYLOCAINE) 1 % injection (not administered)      Initial Impression / Assessment and Plan / ED Course  I have reviewed the triage vital signs and the nursing notes.  Pertinent labs & imaging results that were available during my care of the patient were reviewed by me and considered in my medical decision making (see chart for details).     Vitals:   08/08/16 2136 08/08/16 2137  BP: 122/78   Pulse: 109   Resp: 18   Temp: 98.1 F (36.7 C)   TempSrc: Oral   SpO2: 96%   Weight:  124.7 kg  Height:  5\' 11"  (1.803 m)    Medications  cefTRIAXone (ROCEPHIN) injection 250 mg (not administered)  azithromycin (ZITHROMAX) tablet 1,000 mg (not administered)  lidocaine (PF) (XYLOCAINE) 1 % injection (not administered)    Seth Taylor is 21 y.o. male presenting with Urethral discharge after having sex 3 days ago. He is HIV positive however he is immunocompetent and viral load was undetected at last check. He will be treated for urethritis, syphilis and GC chlamydia pending. I've advised him to have no sex until he has the results of the full STD screen and patient verbalized understanding and teach back technique.  Evaluation does not show pathology that would require ongoing emergent intervention or inpatient treatment. Pt is hemodynamically stable and mentating appropriately. Discussed findings and plan with patient/guardian, who agrees with care plan. All questions answered. Return precautions discussed and outpatient follow up given.    Final Clinical Impressions(s) / ED Diagnoses   Final diagnoses:  Urethritis    New Prescriptions New Prescriptions   No medications on file     Wynetta Emeryicole Nathaniel Wakeley, PA-C 08/08/16 2314    Canary Brimhristopher J Tegeler, MD 08/08/16 [phone]

## 2016-08-08 NOTE — Discharge Instructions (Signed)
You have been treated for gonorrhea and chlamydia today however, he will need time to clear the infection. Refrain from having any sex for 14 days.

## 2016-08-08 NOTE — ED Notes (Signed)
Pt states he just noticed the discharge today on his underwear and it is like the previous time when he was diagnosed with an STD. Some burning with urination.  No pain at this time.

## 2016-08-09 LAB — GC/CHLAMYDIA PROBE AMP (~~LOC~~) NOT AT ARMC
Chlamydia: NEGATIVE
NEISSERIA GONORRHEA: POSITIVE — AB

## 2016-10-03 ENCOUNTER — Ambulatory Visit (INDEPENDENT_AMBULATORY_CARE_PROVIDER_SITE_OTHER): Payer: Self-pay

## 2016-10-03 ENCOUNTER — Telehealth: Payer: Self-pay

## 2016-10-03 DIAGNOSIS — K130 Diseases of lips: Secondary | ICD-10-CM

## 2016-10-03 NOTE — Progress Notes (Signed)
See phone note

## 2016-10-03 NOTE — Patient Instructions (Signed)
Call on Monday for results.

## 2016-10-05 LAB — HERPES SIMPLEX VIRUS CULTURE: Organism ID, Bacteria: NOT DETECTED

## 2016-10-08 ENCOUNTER — Other Ambulatory Visit: Payer: Self-pay

## 2016-10-22 ENCOUNTER — Ambulatory Visit (INDEPENDENT_AMBULATORY_CARE_PROVIDER_SITE_OTHER): Payer: Self-pay | Admitting: Pharmacist Clinician (PhC)/ Clinical Pharmacy Specialist

## 2016-10-22 DIAGNOSIS — Z113 Encounter for screening for infections with a predominantly sexual mode of transmission: Secondary | ICD-10-CM

## 2016-10-22 DIAGNOSIS — B20 Human immunodeficiency virus [HIV] disease: Secondary | ICD-10-CM

## 2016-10-22 DIAGNOSIS — Z79899 Other long term (current) drug therapy: Secondary | ICD-10-CM

## 2016-10-22 MED ORDER — ELVITEG-COBIC-EMTRICIT-TENOFAF 150-150-200-10 MG PO TABS
1.0000 | ORAL_TABLET | Freq: Every day | ORAL | 2 refills | Status: DC
Start: 2016-10-22 — End: 2016-10-24

## 2016-10-22 NOTE — Patient Instructions (Signed)
[facility] Ask the judge to see if you can server the sentence after 6/5

## 2016-10-22 NOTE — Progress Notes (Signed)
HPI: Seth Taylor is a 21 y.o. male who is here to see us again after being MIA for year.   Allergies: No Known Allergies  Vitals:    Past Medical History: Past Medical History:  Diagnosis Date  . HIV disease (HCC)   . Mononucleosis   . Syphilis     Social History: Social History   Social History  . Marital status: Single    Spouse name: N/A  . Number of children: N/A  . Years of education: N/A   Social History Main Topics  . Smoking status: Former Games developermoker  . Smokeless tobacco: Never Used  . Alcohol use 0.0 oz/week     Comment: "on occassion"  . Drug use: Yes    Types: Marijuana  . Sexual activity: Not Currently   Other Topics Concern  . Not on file   Social History Narrative  . No narrative on file    Previous Regimen:   Current Regimen: Genvoya  Labs: HIV 1 RNA Quant (copies/mL)  Date Value  11/03/2015 <20  05/11/2015 <20  08/23/2014 <20   CD4 T Cell Abs (/uL)  Date Value  11/03/2015 660  05/11/2015 590  08/23/2014 560   Hep B S Ab (no units)  Date Value  05/20/2014 NEG   Hepatitis B Surface Ag (no units)  Date Value  05/20/2014 NEGATIVE   HCV Ab (no units)  Date Value  05/20/2014 NEGATIVE    CrCl: CrCl cannot be calculated (Patient's most recent lab result is older than the maximum 21 days allowed.).  Lipids:    Component Value Date/Time   CHOL 180 (H) 11/03/2015 1447   TRIG 226 (H) 11/03/2015 1447   HDL 48 11/03/2015 1447   CHOLHDL 3.8 11/03/2015 1447   VLDL 45 (H) 11/03/2015 1447   LDLCALC 87 11/03/2015 1447    Assessment: [name] has not seen us in a while. Apparently, he is going through some legal issue right now also due to some breaking and entering. He may have to serve some time soon (~45 days) and has to appear before the judge on 5/21 for sentencing? He has an appt coming up to see Dr. Ninetta LightsHatcher. Advised him to see if his attorney can ask the judge to postpone the jail time until after 6/5.   Stressed to him  repeatedly that he needs to make his visit for his HIV care. We can't blindly refill his meds without visit. Explained to him the complication of HIV again and why he needs to stay in care. [facility] with only enough refill until the appt with Dr. Ninetta LightsHatcher. If he will be in jail, he'll likely get the meds there also. He has been off of therapy for 8 days when he ran out of meds. Before that he stated that he didn't miss doses of his Genvoya. We are going to get all labs today.  Recommendations:  Refill Genvoya x 2 months All HIV labs today F/u with Dr. Ninetta LightsHatcher on 6/5  Seth SouthwardMinh Arael Taylor, PharmD, BCPS, AAHIVP, CPP Clinical Infectious Disease Pharmacist [facility] 10/22/2016, 3:26 PM

## 2016-10-23 ENCOUNTER — Ambulatory Visit: Payer: Self-pay

## 2016-10-23 LAB — COMPLETE METABOLIC PANEL WITH GFR
ALT: 23 U/L (ref 9–46)
AST: 19 U/L (ref 10–40)
Albumin: 4.1 g/dL (ref 3.6–5.1)
Alkaline Phosphatase: 76 U/L (ref 40–115)
BUN: 10 mg/dL (ref 7–25)
CALCIUM: 9.1 mg/dL (ref 8.6–10.3)
CHLORIDE: 100 mmol/L (ref 98–110)
CO2: 27 mmol/L (ref 20–31)
Creat: 0.93 mg/dL (ref 0.60–1.35)
GFR, Est African American: >89 mL/min (ref >=60)
GFR, Est Non African American: >89 mL/min (ref >=60)
GLUCOSE: 77 mg/dL (ref 65–99)
POTASSIUM: 4 mmol/L (ref 3.5–5.3)
SODIUM: 138 mmol/L (ref 135–146)
Total Bilirubin: 0.5 mg/dL (ref 0.2–1.2)
Total Protein: 6.9 g/dL (ref 6.1–8.1)

## 2016-10-23 LAB — T-HELPER CELL (CD4) - (RCID CLINIC ONLY)
CD4 T CELL HELPER: 25 % — AB (ref 33–55)
CD4 T Cell Abs: 830 /uL (ref 400–2700)

## 2016-10-23 LAB — RPR TITER

## 2016-10-23 LAB — LIPID PANEL
CHOL/HDL RATIO: 4.2 ratio (ref <5.0)
CHOLESTEROL: 271 mg/dL — AB (ref <200)
HDL: 65 mg/dL (ref >40)
LDL Cholesterol: 139 mg/dL — ABNORMAL HIGH (ref <100)
TRIGLYCERIDES: 335 mg/dL — AB (ref <150)
VLDL: 67 mg/dL — AB (ref <30)

## 2016-10-23 LAB — URINE CYTOLOGY ANCILLARY ONLY
Chlamydia: NEGATIVE
NEISSERIA GONORRHEA: NEGATIVE

## 2016-10-23 LAB — RPR: RPR Ser Ql: REACTIVE — AB

## 2016-10-24 ENCOUNTER — Other Ambulatory Visit: Payer: Self-pay | Admitting: *Deleted

## 2016-10-24 DIAGNOSIS — B20 Human immunodeficiency virus [HIV] disease: Secondary | ICD-10-CM

## 2016-10-24 LAB — FLUORESCENT TREPONEMAL AB(FTA)-IGG-BLD: FLUORESCENT TREPONEMAL ABS: REACTIVE — AB

## 2016-10-24 MED ORDER — ELVITEG-COBIC-EMTRICIT-TENOFAF 150-150-200-10 MG PO TABS: 1.0000 | ORAL_TABLET | Freq: Every day | ORAL | 5 refills | Status: DC

## 2016-10-25 ENCOUNTER — Encounter: Payer: Self-pay | Admitting: Infectious Diseases

## 2016-10-25 LAB — HIV-1 RNA,QN PCR W/REFLEX GENOTYPE: HIV-1 RNA, QN PCR: <1.3 Log cps/mL — ABNORMAL HIGH

## 2016-10-29 ENCOUNTER — Telehealth: Payer: Self-pay | Admitting: *Deleted

## 2016-10-29 DIAGNOSIS — Z789 Other specified health status: Secondary | ICD-10-CM

## 2016-10-29 NOTE — Telephone Encounter (Signed)
Thank you Onita Pfluger.  

## 2016-10-29 NOTE — Telephone Encounter (Signed)
Patient walked into clinic to see if he could have a letter written to his judge to postpone sentencing until his medication is here. RN unable to do this.  Per Olegario MessierKathy, [employer] should be delivered sometime this week.  Patient has reapplied for ADAP.  [facility]  Per Amy, a nurse is allowed to bring medication to the jail as long as it is in it's original, unopened container. Will 'CC Lind CovertAmbre McNeil for assistance. Andree CossHowell, Rommel Hogston M, RN

## 2016-10-29 NOTE — Addendum Note (Signed)
Addended by: Lind CovertMCNEIL, Chandlar Staebell L on: 10/29/2016 10:45 AM   Modules accepted: Orders

## 2016-10-29 NOTE — Telephone Encounter (Signed)
I would be happy to. I will add the referral order and make plans.

## 2016-10-30 ENCOUNTER — Other Ambulatory Visit: Payer: Self-pay

## 2016-11-02 ENCOUNTER — Telehealth: Payer: Self-pay | Admitting: *Deleted

## 2016-11-02 ENCOUNTER — Ambulatory Visit: Payer: Self-pay | Admitting: *Deleted

## 2016-11-02 DIAGNOSIS — Z789 Other specified health status: Secondary | ICD-10-CM

## 2016-11-02 NOTE — Telephone Encounter (Signed)
Referral received for medication assistance. [facility] and patient was placed in custody on 10/29/16 and is currently incarcerated at their facility.  RN updated [facility] on the patient's next appt for 06/05 and the detention center already has a booked schedule for that day. MD visit rescheduled for the next available which is 06/25 to ensure the patient can be transported to the clinic for his visit.   RN arranged with [facility] medications delivery to ensure the patient has his viral medications.  RN contacted Kathy/[facility] who confirmed that the medications have arrived.  RN arranged a visit for medication management

## 2016-11-13 ENCOUNTER — Ambulatory Visit: Payer: Self-pay | Admitting: Infectious Diseases

## 2016-11-15 NOTE — Progress Notes (Signed)
RN received a VO from Dr Daiva EvesVan Dam to assist with medication management. Currently the patient has been sentenced and [facility]. [facility].   [facility]. [facility]

## 2016-11-27 ENCOUNTER — Other Ambulatory Visit: Payer: Self-pay | Admitting: *Deleted

## 2016-11-27 ENCOUNTER — Ambulatory Visit: Payer: Self-pay

## 2016-11-27 DIAGNOSIS — B20 Human immunodeficiency virus [HIV] disease: Secondary | ICD-10-CM

## 2016-11-27 MED ORDER — ELVITEG-COBIC-EMTRICIT-TENOFAF 150-150-200-10 MG PO TABS: 1.0000 | ORAL_TABLET | Freq: Every day | ORAL | 5 refills | Status: DC

## 2016-12-03 ENCOUNTER — Ambulatory Visit: Payer: Self-pay | Admitting: Infectious Diseases

## 2016-12-25 ENCOUNTER — Ambulatory Visit: Payer: Self-pay

## 2017-01-10 ENCOUNTER — Ambulatory Visit: Payer: Self-pay

## 2017-04-08 NOTE — Telephone Encounter (Signed)
Chart audit

## 2017-05-07 ENCOUNTER — Emergency Department (HOSPITAL_COMMUNITY)
Admission: EM | Admit: 2017-05-07 | Discharge: 2017-05-07 | Disposition: A | Discharge status: Home / Self Care | Payer: Self-pay | Attending: Emergency Medicine | Admitting: Emergency Medicine

## 2017-05-07 ENCOUNTER — Emergency Department (HOSPITAL_COMMUNITY): Payer: Self-pay

## 2017-05-07 ENCOUNTER — Ambulatory Visit: Payer: Self-pay

## 2017-05-07 ENCOUNTER — Encounter (HOSPITAL_COMMUNITY): Payer: Self-pay | Admitting: Emergency Medicine

## 2017-05-07 DIAGNOSIS — B20 Human immunodeficiency virus [HIV] disease: Secondary | ICD-10-CM | POA: Insufficient documentation

## 2017-05-07 DIAGNOSIS — M545 Low back pain: Secondary | ICD-10-CM | POA: Insufficient documentation

## 2017-05-07 DIAGNOSIS — Z87891 Personal history of nicotine dependence: Secondary | ICD-10-CM | POA: Insufficient documentation

## 2017-05-07 DIAGNOSIS — Z79899 Other long term (current) drug therapy: Secondary | ICD-10-CM | POA: Insufficient documentation

## 2017-05-07 DIAGNOSIS — R059 Cough, unspecified: Secondary | ICD-10-CM

## 2017-05-07 DIAGNOSIS — J040 Acute laryngitis: Secondary | ICD-10-CM | POA: Insufficient documentation

## 2017-05-07 DIAGNOSIS — R05 Cough: Secondary | ICD-10-CM

## 2017-05-07 DIAGNOSIS — R51 Headache: Secondary | ICD-10-CM | POA: Insufficient documentation

## 2017-05-07 DIAGNOSIS — R0602 Shortness of breath: Secondary | ICD-10-CM | POA: Insufficient documentation

## 2017-05-07 DIAGNOSIS — J40 Bronchitis, not specified as acute or chronic: Secondary | ICD-10-CM

## 2017-05-07 LAB — CBC WITH DIFFERENTIAL/PLATELET
Basophils Absolute: 0 10*3/uL (ref 0.0–0.1)
Basophils Relative: 0 %
EOS ABS: 0.3 10*3/uL (ref 0.0–0.7)
EOS PCT: 4 %
HCT: 44.3 % (ref 39.0–52.0)
HEMOGLOBIN: 15.4 g/dL (ref 13.0–17.0)
LYMPHS ABS: 1.5 10*3/uL (ref 0.7–4.0)
LYMPHS PCT: 23 %
MCH: 28.4 pg (ref 26.0–34.0)
MCHC: 34.8 g/dL (ref 30.0–36.0)
MCV: 81.6 fL (ref 78.0–100.0)
MONOS PCT: 11 %
Monocytes Absolute: 0.8 10*3/uL (ref 0.1–1.0)
Neutro Abs: 4.2 10*3/uL (ref 1.7–7.7)
Neutrophils Relative %: 62 %
PLATELETS: 227 10*3/uL (ref 150–400)
RBC: 5.43 MIL/uL (ref 4.22–5.81)
RDW: 13.6 % (ref 11.5–15.5)
WBC: 6.8 10*3/uL (ref 4.0–10.5)

## 2017-05-07 LAB — BASIC METABOLIC PANEL
ANION GAP: 8 (ref 5–15)
BUN: 15 mg/dL (ref 6–20)
CHLORIDE: 102 mmol/L (ref 101–111)
CO2: 25 mmol/L (ref 22–32)
Calcium: 8.8 mg/dL — ABNORMAL LOW (ref 8.9–10.3)
Creatinine, Ser: 1.02 mg/dL (ref 0.61–1.24)
GFR calc non Af Amer: >60 mL/min (ref >60)
GLUCOSE: 89 mg/dL (ref 65–99)
POTASSIUM: 4.8 mmol/L (ref 3.5–5.1)
Sodium: 135 mmol/L (ref 135–145)

## 2017-05-07 LAB — INFLUENZA PANEL BY PCR (TYPE A & B)
Influenza A By PCR: NEGATIVE
Influenza B By PCR: NEGATIVE

## 2017-05-07 MED ORDER — ALBUTEROL SULFATE HFA 108 (90 BASE) MCG/ACT IN AERS
2.0000 | INHALATION_SPRAY | RESPIRATORY_TRACT | Status: DC | PRN
  Administered 2017-05-07: 2 via RESPIRATORY_TRACT
  Filled 2017-05-07: qty 6.7

## 2017-05-07 MED ORDER — IPRATROPIUM-ALBUTEROL 0.5-2.5 (3) MG/3ML IN SOLN
3.0000 mL | Freq: Once | RESPIRATORY_TRACT | Status: AC
  Administered 2017-05-07: 3 mL via RESPIRATORY_TRACT
  Filled 2017-05-07: qty 3

## 2017-05-07 MED ORDER — LEVOFLOXACIN 750 MG PO TABS: 750.0000 mg | ORAL_TABLET | Freq: Every day | ORAL | 0 refills | Status: DC

## 2017-05-07 NOTE — Discharge Instructions (Signed)
It is important that you follow up with your doctor as soon as possible.

## 2017-05-07 NOTE — ED Provider Notes (Signed)
[facility]-EMERGENCY DEPT Provider Note   CSN: [id number] Arrival date & time: 05/07/17  1107     History   Chief Complaint Chief Complaint  Patient presents with  . Back Pain  . Cough    HPI Seth Taylor is a 20 y.o. male with hx of HIV, syphilis, GC and mono who arrived via EMS for cough, congestion, fever and chills that started about a week ago and has gotten worse. Patient reports feeling short of breath and feeling like he is wheezing. He c/o headache. Patient reports that he did not get the flu vaccine. His last visit to ID clinic he thinks was about 6 months ago.   Patient reports chronic low back pain that he thinks results from his HIV medication. When he stops his medication for a few days the back pain gets better.   The history is provided by the patient. No language interpreter was used.  Back Pain   This is a chronic (one year since starting HIV medications) problem. The problem occurs constantly. The problem has not changed since onset.The pain is associated with no known injury. The pain is present in the lumbar spine. The pain does not radiate. The pain is at a severity of 9/10. Associated symptoms include a fever and headaches. Pertinent negatives include no abdominal pain, no bowel incontinence, no bladder incontinence, no dysuria and no weakness. Chest pain: with cough. He has tried nothing for the symptoms.  Cough  The current episode started more than 2 days ago. The problem has been gradually worsening. The cough is productive of sputum. The maximum temperature recorded prior to his arrival was 101 to 101.9 F. Associated symptoms include chills, headaches, rhinorrhea, myalgias, shortness of breath and wheezing. Pertinent negatives include no sweats, no ear congestion, no ear pain, no sore throat and no eye redness. Chest pain: with cough. He has tried nothing for the symptoms. Risk factors: HIV. He is a smoker.    Past Medical History:    Diagnosis Date  . HIV disease (HCC)   . Mononucleosis   . Syphilis     Patient Active Problem List   Diagnosis Date Noted  . HIV disease (HCC) 07/08/2014  . Syphilis 07/08/2014  . Gonorrhea 07/08/2014  . Hypertriglyceridemia 07/08/2014    History reviewed. No pertinent surgical history.     Home Medications    Prior to Admission medications   Medication Sig Start Date End Date Taking? Authorizing Provider  elvitegravir-cobicistat-emtricitabine-tenofovir (GENVOYA) 150-150-200-10 MG TABS tablet Take 1 tablet by mouth daily with breakfast. 11/27/16   Comer, Belia Heman, MD  levofloxacin (LEVAQUIN) 750 MG tablet Take 1 tablet (750 mg total) by mouth daily. 05/07/17   Janne Napoleon, NP    Family History Family History  Problem Relation Age of Onset  . CVA Mother        seconday to drug use  . Diabetes Father   . Hepatitis C Father     Social History Social History   Tobacco Use  . Smoking status: Former Games developer  . Smokeless tobacco: Never Used  Substance Use Topics  . Alcohol use: Yes    Alcohol/week: 0.0 oz    Comment: "on occassion"  . Drug use: Yes    Types: Marijuana     Allergies   Patient has no known allergies.   Review of Systems Review of Systems  Constitutional: Positive for chills and fever.  HENT: Positive for congestion, rhinorrhea and sneezing. Negative for ear pain,  sore throat and trouble swallowing.   Eyes: Negative for discharge, redness and itching.  Respiratory: Positive for cough, shortness of breath and wheezing.   Cardiovascular: Chest pain: with cough.  Gastrointestinal: Negative for abdominal pain, bowel incontinence and nausea.  Genitourinary: Negative for bladder incontinence and dysuria.  Musculoskeletal: Positive for back pain and myalgias.  Skin: Negative for rash and wound.  Neurological: Positive for headaches. Negative for dizziness, syncope, weakness and light-headedness.  Psychiatric/Behavioral: Negative for confusion. The  patient is not nervous/anxious.      Physical Exam Updated Vital Signs BP 137/87 (BP Location: Right Arm)   Pulse 77   Temp 98.6 F (37 C)   Resp 18   SpO2 99%   Physical Exam  Constitutional: He appears well-developed and well-nourished. No distress.  HENT:  Head: Normocephalic and atraumatic.  Right Ear: Tympanic membrane is erythematous.  Left Ear: Tympanic membrane normal.  Nose: Mucosal edema and rhinorrhea present.  Mouth/Throat: Uvula is midline and mucous membranes are normal. Posterior oropharyngeal erythema present.  Eyes: Conjunctivae and EOM are normal. Pupils are equal, round, and reactive to light.  Neck: Neck supple.  Cardiovascular: Normal rate and regular rhythm.  Pulmonary/Chest: Effort normal. He has decreased breath sounds.  Abdominal: Soft. There is no tenderness.  Musculoskeletal:       Back:  Tender right lumbar area.  Neurological: He is alert.  Skin: Skin is warm and dry.  Psychiatric: He has a normal mood and affect. His behavior is normal.  Nursing note and vitals reviewed.    ED Treatments / Results  Labs (all labs ordered are listed, but only abnormal results are displayed) Labs Reviewed  BASIC METABOLIC PANEL - Abnormal; Notable for the following components:      Result Value   Calcium 8.8 (*)    All other components within normal limits  INFLUENZA PANEL BY PCR (TYPE A & B)  CBC WITH DIFFERENTIAL/PLATELET     Radiology Dg Chest 2 View  Result Date: 05/07/2017 CLINICAL DATA:  Cough.  Chest pain. EXAM: CHEST  2 VIEW COMPARISON:  08/16/2015 . FINDINGS: Mediastinum and hilar structures are normal. Low lung volumes with mild right base atelectasis/infiltrate P No pleural effusion or pneumothorax. Degenerative changes thoracic spine . IMPRESSION: Low lung volumes with mild right base atelectasis/infiltrate. No pleural effusion or pneumothorax. Electronically Signed   By: Maisie Fushomas  Register   On: 05/07/2017 11:51    Procedures Procedures  (including critical care time)  Medications Ordered in ED Medications  ipratropium-albuterol (DUONEB) 0.5-2.5 (3) MG/3ML nebulizer solution 3 mL (3 mLs Nebulization Given 05/07/17 1205)     Initial Impression / Assessment and Plan / ED Course  I have reviewed the triage vital signs and the nursing notes. 21 y.o. male with HIV here today with cough, congestion and feeling like he is wheezing and short of breath. No definite pneumonia but will treat with antibiotics due to patient's medical history. Patient non toxic appearing and stable for d/c to f/u with the ID clinic. Albuterol inhaler given prior to d/c. Discussed with the patient in detail need for f/u with his doctor in the next 24 to 48 hours. Return precautions discussed. Encouraged flu shot after this illness resolves.    Final Clinical Impressions(s) / ED Diagnoses   Final diagnoses:  Cough  Bronchitis    ED Discharge Orders        Ordered    levofloxacin (LEVAQUIN) 750 MG tablet  Daily     05/07/17 1333  Kerrie Buffaloeese, Elmin Wiederholt North HodgeM, [place] 05/07/17 1735    Lavera GuiseLiu, Dana Duo, MD 05/10/17 1250

## 2017-05-07 NOTE — ED Triage Notes (Signed)
Patient here from home via EMS. Complaints of lower intermittent back pain x1 year. Productive cough, yellow green sputum, self treating with Nyquil. Hx of HIV disease. Reports that he thinks back pain is due to medication he takes for HIV.

## 2017-08-13 ENCOUNTER — Ambulatory Visit (HOSPITAL_COMMUNITY)
Admission: EM | Admit: 2017-08-13 | Discharge: 2017-08-13 | Disposition: A | Discharge status: Home / Self Care | Payer: Self-pay | Attending: Family Medicine | Admitting: Family Medicine

## 2017-09-25 ENCOUNTER — Emergency Department
Admission: EM | Admit: 2017-09-25 | Discharge: 2017-09-25 | Disposition: A | Discharge status: Home / Self Care | Payer: Medicaid Other | Attending: Emergency Medicine | Admitting: Emergency Medicine

## 2017-09-25 ENCOUNTER — Other Ambulatory Visit: Payer: Self-pay

## 2017-09-25 DIAGNOSIS — R21 Rash and other nonspecific skin eruption: Secondary | ICD-10-CM | POA: Insufficient documentation

## 2017-09-25 DIAGNOSIS — Z5321 Procedure and treatment not carried out due to patient leaving prior to being seen by health care provider: Secondary | ICD-10-CM | POA: Insufficient documentation

## 2017-09-25 NOTE — ED Triage Notes (Signed)
Pt arrives to ED via POV from home with c/o generalized rash x5 weeks. Pt denies any new lotions, soaps, or detergents; no news medications or foods. Pt reports place he has been staying "might have bedbugs". Pt denies trouble breathing, oral swelling, or swallowing.

## 2017-09-26 ENCOUNTER — Encounter: Payer: Self-pay | Admitting: Emergency Medicine

## 2017-09-26 ENCOUNTER — Other Ambulatory Visit: Payer: Self-pay

## 2017-09-26 ENCOUNTER — Emergency Department
Admission: EM | Admit: 2017-09-26 | Discharge: 2017-09-26 | Disposition: A | Discharge status: Home / Self Care | Payer: Medicaid Other | Attending: Emergency Medicine | Admitting: Emergency Medicine

## 2017-09-26 DIAGNOSIS — L210 Seborrhea capitis: Secondary | ICD-10-CM

## 2017-09-26 DIAGNOSIS — Z79899 Other long term (current) drug therapy: Secondary | ICD-10-CM | POA: Insufficient documentation

## 2017-09-26 DIAGNOSIS — Z87891 Personal history of nicotine dependence: Secondary | ICD-10-CM | POA: Insufficient documentation

## 2017-09-26 DIAGNOSIS — B088 Other specified viral infections characterized by skin and mucous membrane lesions: Secondary | ICD-10-CM | POA: Insufficient documentation

## 2017-09-26 DIAGNOSIS — B20 Human immunodeficiency virus [HIV] disease: Secondary | ICD-10-CM | POA: Insufficient documentation

## 2017-09-26 MED ORDER — PREDNISONE 10 MG (21) PO TBPK: ORAL_TABLET | ORAL | 0 refills | Status: DC

## 2017-09-26 NOTE — Discharge Instructions (Signed)
Follow-up with your regular doctor if not better in 1 to 2 weeks.  He can also take Benadryl as needed.

## 2017-09-26 NOTE — ED Provider Notes (Signed)
[facility] Emergency Department Provider Note  ____________________________________________   First MD Initiated Contact with Patient 09/26/17 2122     (approximate)  I have reviewed the triage vital signs and the nursing notes.   HISTORY  Chief Complaint Rash    HPI Seth Taylor is a 22 y.o. male presents emergency department complaining of a rash for 5 weeks.  He states that he had been staying at a friend's house and sometimes was sleeping on the floor.  States the rash is just on the arms and legs.  He states it is a little itchy but not bad.  He did have cold symptoms prior to the rash appearing.  He denies any fever or chills.  His past medical history is listed as below.  Past Medical History:  Diagnosis Date  . HIV disease (HCC)   . Mononucleosis   . Syphilis     Patient Active Problem List   Diagnosis Date Noted  . HIV disease (HCC) 07/08/2014  . Syphilis 07/08/2014  . Gonorrhea 07/08/2014  . Hypertriglyceridemia 07/08/2014    History reviewed. No pertinent surgical history.  Prior to Admission medications   Medication Sig Start Date End Date Taking? Authorizing Provider  elvitegravir-cobicistat-emtricitabine-tenofovir (GENVOYA) 150-150-200-10 MG TABS tablet Take 1 tablet by mouth daily with breakfast. 11/27/16   Comer, Belia Heman, MD  levofloxacin (LEVAQUIN) 750 MG tablet Take 1 tablet (750 mg total) by mouth daily. 05/07/17   Janne Napoleon, NP  predniSONE (STERAPRED UNI-PAK 21 TAB) 10 MG (21) TBPK tablet Take 6 pills on day one then decrease by 1 pill each day 09/26/17   Faythe Ghee, PA-C    Allergies Patient has no known allergies.  Family History  Problem Relation Age of Onset  . CVA Mother        seconday to drug use  . Diabetes Father   . Hepatitis C Father     Social History Social History   Tobacco Use  . Smoking status: Former Games developer  . Smokeless tobacco: Never Used  Substance Use Topics  . Alcohol use: Yes     Alcohol/week: 0.0 oz    Comment: "on occassion"  . Drug use: Yes    Types: Marijuana    Review of Systems  Constitutional: No fever/chills Eyes: No visual changes. ENT: No sore throat. Respiratory: Denies cough or congestion at this time Genitourinary: Negative for dysuria. Musculoskeletal: Negative for back pain. Skin: Positive for rash.    ____________________________________________   PHYSICAL EXAM:  VITAL SIGNS: ED Triage Vitals  Enc Vitals Group     BP 09/26/17 1951 (!) 142/72     Pulse Rate 09/26/17 1951 87     Resp 09/26/17 1951 20     Temp 09/26/17 1951 98 F (36.7 C)     Temp Source 09/26/17 1951 Oral     SpO2 09/26/17 1951 99 %     Weight 09/26/17 1950 275 lb (124.7 kg)     Height 09/26/17 1950 6' (1.829 m)     Head Circumference --      Peak Flow --      Pain Score 09/26/17 1950 0     Pain Loc --      Pain Edu? --      Excl. in GC? --     Constitutional: Alert and oriented. Well appearing and in no acute distress. Eyes: Conjunctivae are normal.  Head: Atraumatic. Nose: No congestion/rhinnorhea. Mouth/Throat: Mucous membranes are moist.   Cardiovascular: Normal  rate, regular rhythm.  Heart sounds are normal Respiratory: Normal respiratory effort.  No retractions, lungs clear to auscultation GU: deferred Musculoskeletal: FROM all extremities, warm and well perfused Neurologic:  Normal speech and language.  Skin:  Skin is warm, dry and intact.  A lacy patchy rashes noted over the extremities upper and lower.  No pustules or vesicles are noted.  No linear burrows are noted.  Neurovascular is intact Psychiatric: Mood and affect are normal. Speech and behavior are normal.  ____________________________________________   LABS (all labs ordered are listed, but only abnormal results are displayed)  Labs Reviewed - No data to  display ____________________________________________   ____________________________________________  RADIOLOGY    ____________________________________________   PROCEDURES  Procedure(s) performed: No  Procedures    ____________________________________________   INITIAL IMPRESSION / ASSESSMENT AND PLAN / ED COURSE  Pertinent labs & imaging results that were available during my care of the patient were reviewed by me and considered in my medical decision making (see chart for details).  Patient is 22 year old male presents emergency department complaining of a rash on his upper and lower extremities for 5 weeks  On physical exam patient has a lacy patterned red rash on the arms and legs.  No pustules or vesicles are noted  Diagnosis is viral rash.  Most likely pityriasis even though it is not on the chest.  Discussed this with the patient.  Showed him pictures of similar rashes.  He states it looks exactly like when he has.  He was given a prescription for Sterapred and was instructed to take Benadryl if needed.  He states he understands will comply with our instructions.  He was discharged in stable condition     As part of my medical decision making, I reviewed the following data within the electronic MEDICAL RECORD NUMBER Nursing notes reviewed and incorporated, Notes from prior ED visits and [place] Controlled Substance Database  ____________________________________________   FINAL CLINICAL IMPRESSION(S) / ED DIAGNOSES  Final diagnoses:  Pityriasis      NEW MEDICATIONS STARTED DURING THIS VISIT:  Discharge Medication List as of 09/26/2017  9:36 PM    START taking these medications   Details  predniSONE (STERAPRED UNI-PAK 21 TAB) 10 MG (21) TBPK tablet Take 6 pills on day one then decrease by 1 pill each day, Print         Note:  This document was prepared using Dragon voice recognition software and may include unintentional dictation errors.    Faythe GheeFisher, Susan  W, PA-C 09/26/17 2233    Minna AntisPaduchowski, Kevin, MD 09/26/17 2250

## 2017-09-26 NOTE — ED Triage Notes (Signed)
Patient ambulatory to triage with steady gait, without difficulty or distress noted; pt reports x 5wks having generalized itchy rash with no known cause; st left before being seen yesterday

## 2018-02-18 ENCOUNTER — Other Ambulatory Visit: Payer: Self-pay

## 2018-02-18 ENCOUNTER — Emergency Department (HOSPITAL_COMMUNITY)
Admission: EM | Admit: 2018-02-18 | Discharge: 2018-02-18 | Discharge status: Absent without leave | Payer: Medicaid Other

## 2018-02-18 ENCOUNTER — Emergency Department (HOSPITAL_COMMUNITY)
Admission: EM | Admit: 2018-02-18 | Discharge: 2018-02-18 | Disposition: A | Discharge status: Home / Self Care | Payer: Self-pay | Attending: Emergency Medicine | Admitting: Emergency Medicine

## 2018-02-18 ENCOUNTER — Emergency Department (HOSPITAL_COMMUNITY): Payer: Self-pay

## 2018-02-18 ENCOUNTER — Encounter (HOSPITAL_COMMUNITY): Payer: Self-pay | Admitting: *Deleted

## 2018-02-18 DIAGNOSIS — Z87891 Personal history of nicotine dependence: Secondary | ICD-10-CM | POA: Insufficient documentation

## 2018-02-18 DIAGNOSIS — Z79899 Other long term (current) drug therapy: Secondary | ICD-10-CM | POA: Insufficient documentation

## 2018-02-18 DIAGNOSIS — Z21 Asymptomatic human immunodeficiency virus [HIV] infection status: Secondary | ICD-10-CM | POA: Insufficient documentation

## 2018-02-18 DIAGNOSIS — J029 Acute pharyngitis, unspecified: Secondary | ICD-10-CM | POA: Insufficient documentation

## 2018-02-18 DIAGNOSIS — L42 Pityriasis rosea: Secondary | ICD-10-CM | POA: Insufficient documentation

## 2018-02-18 LAB — COMPREHENSIVE METABOLIC PANEL
ALK PHOS: 84 U/L (ref 38–126)
ALT: 29 U/L (ref 0–44)
ANION GAP: 9 (ref 5–15)
AST: 30 U/L (ref 15–41)
Albumin: 4 g/dL (ref 3.5–5.0)
BILIRUBIN TOTAL: 0.9 mg/dL (ref 0.3–1.2)
BUN: 6 mg/dL (ref 6–20)
CO2: 24 mmol/L (ref 22–32)
Calcium: 9.1 mg/dL (ref 8.9–10.3)
Chloride: 105 mmol/L (ref 98–111)
Creatinine, Ser: 0.98 mg/dL (ref 0.61–1.24)
GFR calc non Af Amer: >60 mL/min (ref >60)
GLUCOSE: 95 mg/dL (ref 70–99)
Potassium: 4 mmol/L (ref 3.5–5.1)
SODIUM: 138 mmol/L (ref 135–145)
Total Protein: 7.7 g/dL (ref 6.5–8.1)

## 2018-02-18 LAB — CBC WITH DIFFERENTIAL/PLATELET
Abs Immature Granulocytes: 0 10*3/uL (ref 0.0–0.1)
Basophils Absolute: 0 10*3/uL (ref 0.0–0.1)
Basophils Relative: 0 %
Eosinophils Absolute: 0.1 10*3/uL (ref 0.0–0.7)
Eosinophils Relative: 1 %
HCT: 45.6 % (ref 39.0–52.0)
Hemoglobin: 15.1 g/dL (ref 13.0–17.0)
Immature Granulocytes: 0 %
Lymphocytes Relative: 26 %
Lymphs Abs: 2.5 10*3/uL (ref 0.7–4.0)
MCH: 27.3 pg (ref 26.0–34.0)
MCHC: 33.1 g/dL (ref 30.0–36.0)
MCV: 82.3 fL (ref 78.0–100.0)
Monocytes Absolute: 0.9 10*3/uL (ref 0.1–1.0)
Monocytes Relative: 9 %
Neutro Abs: 6 10*3/uL (ref 1.7–7.7)
Neutrophils Relative %: 64 %
Platelets: 243 10*3/uL (ref 150–400)
RBC: 5.54 MIL/uL (ref 4.22–5.81)
RDW: 13.3 % (ref 11.5–15.5)
WBC: 9.5 10*3/uL (ref 4.0–10.5)

## 2018-02-18 LAB — GROUP A STREP BY PCR: Group A Strep by PCR: NOT DETECTED

## 2018-02-18 LAB — I-STAT CG4 LACTIC ACID, ED: Lactic Acid, Venous: 1.44 mmol/L (ref 0.5–1.9)

## 2018-02-18 MED ORDER — IBUPROFEN 600 MG PO TABS: 600.0000 mg | ORAL_TABLET | Freq: Four times a day (QID) | ORAL | 0 refills | Status: DC | PRN

## 2018-02-18 MED ORDER — ACETAMINOPHEN 325 MG PO TABS
650.0000 mg | ORAL_TABLET | Freq: Once | ORAL | Status: AC
  Administered 2018-02-18: 650 mg via ORAL
  Filled 2018-02-18: qty 2

## 2018-02-18 NOTE — ED Triage Notes (Signed)
Pt in c/o sore throat since last night, unsure of fever at home

## 2018-02-18 NOTE — ED Notes (Signed)
Patient ambulatory to bathroom with steady gait at this time to provide urine sample 

## 2018-02-18 NOTE — ED Notes (Signed)
Patient verbalizes understanding of discharge instructions. Opportunity for questioning and answers were provided. Armband removed by staff, pt discharged from ED ambulatory.   

## 2018-02-18 NOTE — Discharge Instructions (Addendum)
Please read attached information. If you experience any new or worsening signs or symptoms please return to the emergency room for evaluation. Please follow-up with your primary care provider or specialist as discussed. Please use medication prescribed only as directed and discontinue taking if you have any concerning signs or symptoms.   °

## 2018-02-18 NOTE — ED Provider Notes (Signed)
[facility] EMERGENCY DEPARTMENT Provider Note   CSN: [id number] Arrival date & time: 02/18/18  1024    History   Chief Complaint Chief Complaint  Patient presents with  . Sore Throat    HPI Seth Taylor is a 22 y.o. male.  HPI    22 year old male presents today with complaints of sore throat.  Patient notes that symptoms started last night with dry itchy throat, painful swallowing.  Patient denies any fever upper respiratory congestion cough or any other acute concerns.  Patient has no history of strep pharyngitis, he notes that he smokes marijuana with several other people uncertain if they are ill as well.  Patient did not take any medications prior to arrival.  Patient also notes a several month history of rash to the bilateral upper extremities, he was originally seen at an outside emergency room, he was diagnosed with pityriasis rosea and started on prednisone.  He notes he is unable to afford this medication and did not start taking it.  He denies any change in the rash.  Patient reports he is an HIV patient, currently taking in voice, he notes he has been taking this as directed.  She cannot recall his last CD4 count. (Chart review shows 10/22/2016 with CD4 count of 830)     Past Medical History:  Diagnosis Date  . HIV disease (HCC)   . Mononucleosis   . Syphilis     Patient Active Problem List   Diagnosis Date Noted  . HIV disease (HCC) 07/08/2014  . Syphilis 07/08/2014  . Gonorrhea 07/08/2014  . Hypertriglyceridemia 07/08/2014    History reviewed. No pertinent surgical history.      Home Medications    Prior to Admission medications   Medication Sig Start Date End Date Taking? Authorizing Provider  elvitegravir-cobicistat-emtricitabine-tenofovir (GENVOYA) 150-150-200-10 MG TABS tablet Take 1 tablet by mouth daily with breakfast. 11/27/16   Comer, Belia Heman, MD  ibuprofen (ADVIL,MOTRIN) 600 MG tablet Take 1 tablet (600 mg total) by mouth  every 6 (six) hours as needed. 02/18/18   Darold Miley, Tinnie Gens, PA-C  levofloxacin (LEVAQUIN) 750 MG tablet Take 1 tablet (750 mg total) by mouth daily. 05/07/17   Janne Napoleon, NP  predniSONE (STERAPRED UNI-PAK 21 TAB) 10 MG (21) TBPK tablet Take 6 pills on day one then decrease by 1 pill each day 09/26/17   Faythe Ghee, PA-C    Family History Family History  Problem Relation Age of Onset  . CVA Mother        seconday to drug use  . Diabetes Father   . Hepatitis C Father     Social History Social History   Tobacco Use  . Smoking status: Former Games developer  . Smokeless tobacco: Never Used  Substance Use Topics  . Alcohol use: Yes    Alcohol/week: 0.0 standard drinks    Comment: "on occassion"  . Drug use: Yes    Types: Marijuana     Allergies   Patient has no known allergies.   Review of Systems Review of Systems  All other systems reviewed and are negative.    Physical Exam Updated Vital Signs BP 124/82 (BP Location: Right Arm)   Pulse 93   Temp 99.5 F (37.5 C) (Oral)   Resp 19   SpO2 94%   Physical Exam  Constitutional: He is oriented to person, place, and time. He appears well-developed and well-nourished.  HENT:  Head: Normocephalic and atraumatic.  Oropharynx clear no swelling or edema,  no erythema, no pooling of secretions, voice normal-no cervical lymphadenopathy neck supple full active range of motion-bilateral TMs normal  Eyes: Pupils are equal, round, and reactive to light. Conjunctivae are normal. Right eye exhibits no discharge. Left eye exhibits no discharge. No scleral icterus.  Neck: Normal range of motion. No JVD present. No tracheal deviation present.  Pulmonary/Chest: Effort normal. No stridor.  Neurological: He is alert and oriented to person, place, and time. Coordination normal.  Psychiatric: He has a normal mood and affect. His behavior is normal. Judgment and thought content normal.  Nursing note and vitals reviewed.   ED Treatments /  Results  Labs (all labs ordered are listed, but only abnormal results are displayed) Labs Reviewed  GROUP A STREP BY PCR  COMPREHENSIVE METABOLIC PANEL  CBC WITH DIFFERENTIAL/PLATELET  I-STAT CG4 LACTIC ACID, ED  GC/CHLAMYDIA PROBE AMP (Derma) NOT AT [facility]    EKG None  Radiology Dg Chest 2 View  Result Date: 02/18/2018 CLINICAL DATA:  Dyspnea EXAM: CHEST - 2 VIEW COMPARISON:  05/07/2017 chest radiograph. FINDINGS: Stable cardiomediastinal silhouette with normal heart size. No pneumothorax. No pleural effusion. Lungs appear clear, with no acute consolidative airspace disease and no pulmonary edema. IMPRESSION: No active cardiopulmonary disease. Electronically Signed   By: Delbert Phenix M.D.   On: 02/18/2018 10:50    Procedures Procedures (including critical care time)  Medications Ordered in ED Medications  acetaminophen (TYLENOL) tablet 650 mg (650 mg Oral Given 02/18/18 1247)     Initial Impression / Assessment and Plan / ED Course  I have reviewed the triage vital signs and the nursing notes.  Pertinent labs & imaging results that were available during my care of the patient were reviewed by me and considered in my medical decision making (see chart for details).     Labs:   Imaging:  Consults:  Therapeutics: Tylenol  Discharge Meds: Ibuprofen  Assessment/Plan: 22 year old male presents today with complaints of sore throat.  He has no objective findings on my exam today, labs ordered prior to my evaluation which showed no significant abnormalities.  Patient does have HIV, low suspicion for opportunistic infection.  She also has a rash that has not changed in several months, my suspicion for pityriasis rosea.  She will follow-up as an outpatient for ongoing management of this, no immediate need for medical management at this time.  Patient discharged with outpatient follow-up, strict return precautions.  He verbalized understanding and agreement to today's  plan.    Final Clinical Impressions(s) / ED Diagnoses   Final diagnoses:  Sore throat  Pityriasis rosea    ED Discharge Orders         Ordered    ibuprofen (ADVIL,MOTRIN) 600 MG tablet  Every 6 hours PRN     02/18/18 1246           Eyvonne Mechanic, PA-C 02/18/18 1642    Tegeler, Canary Brim, MD 02/18/18 2245

## 2018-02-19 LAB — GC/CHLAMYDIA PROBE AMP (~~LOC~~) NOT AT ARMC
Chlamydia: NEGATIVE
Neisseria Gonorrhea: NEGATIVE

## 2018-02-24 IMAGING — CR DG CHEST 2V
2 series · 2 of 2 positions shown · non-contrast
Comparison: 08/16/2015 .

CLINICAL DATA: Cough.  Chest pain.

EXAM:
CHEST  2 VIEW

[w chest pa]
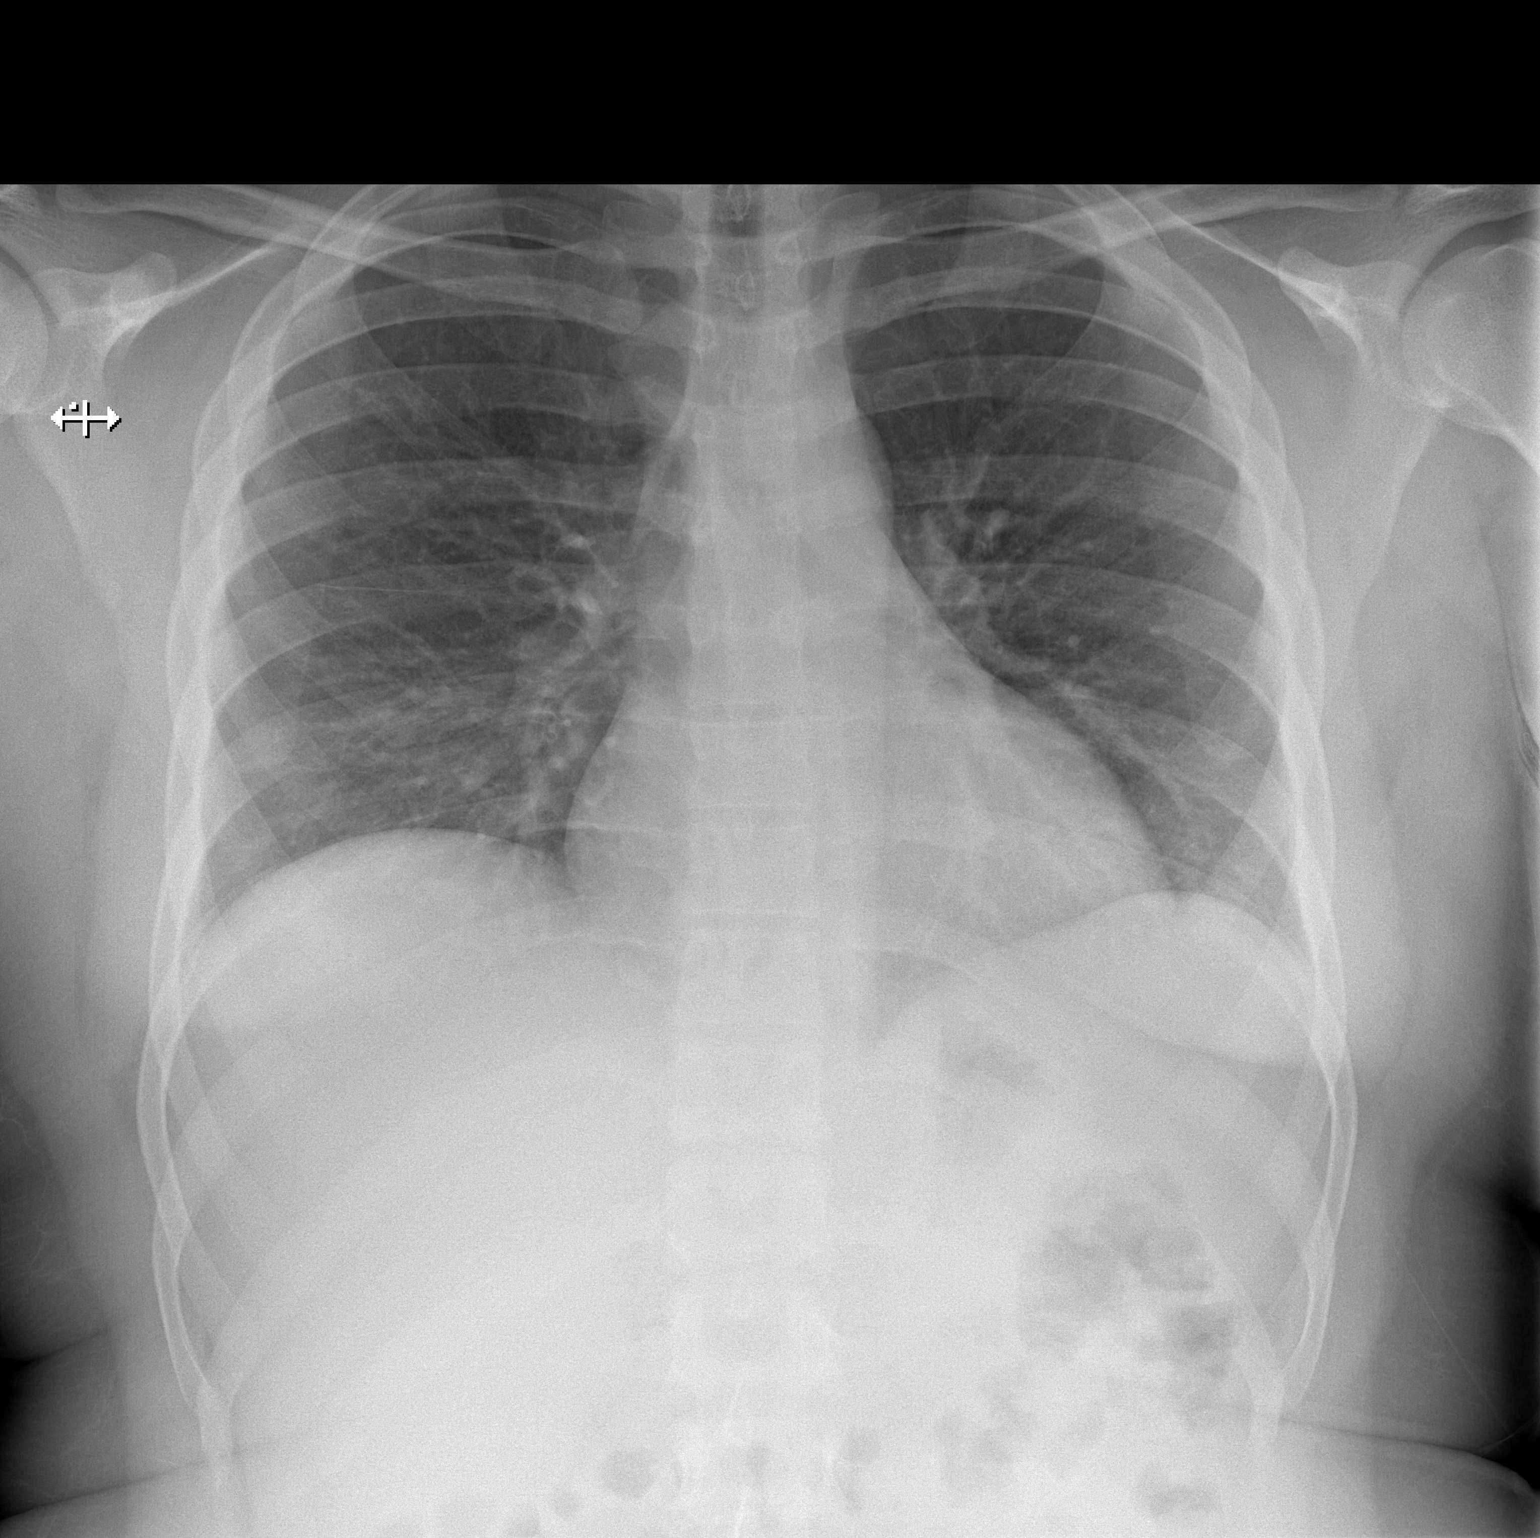

[w chest lat]
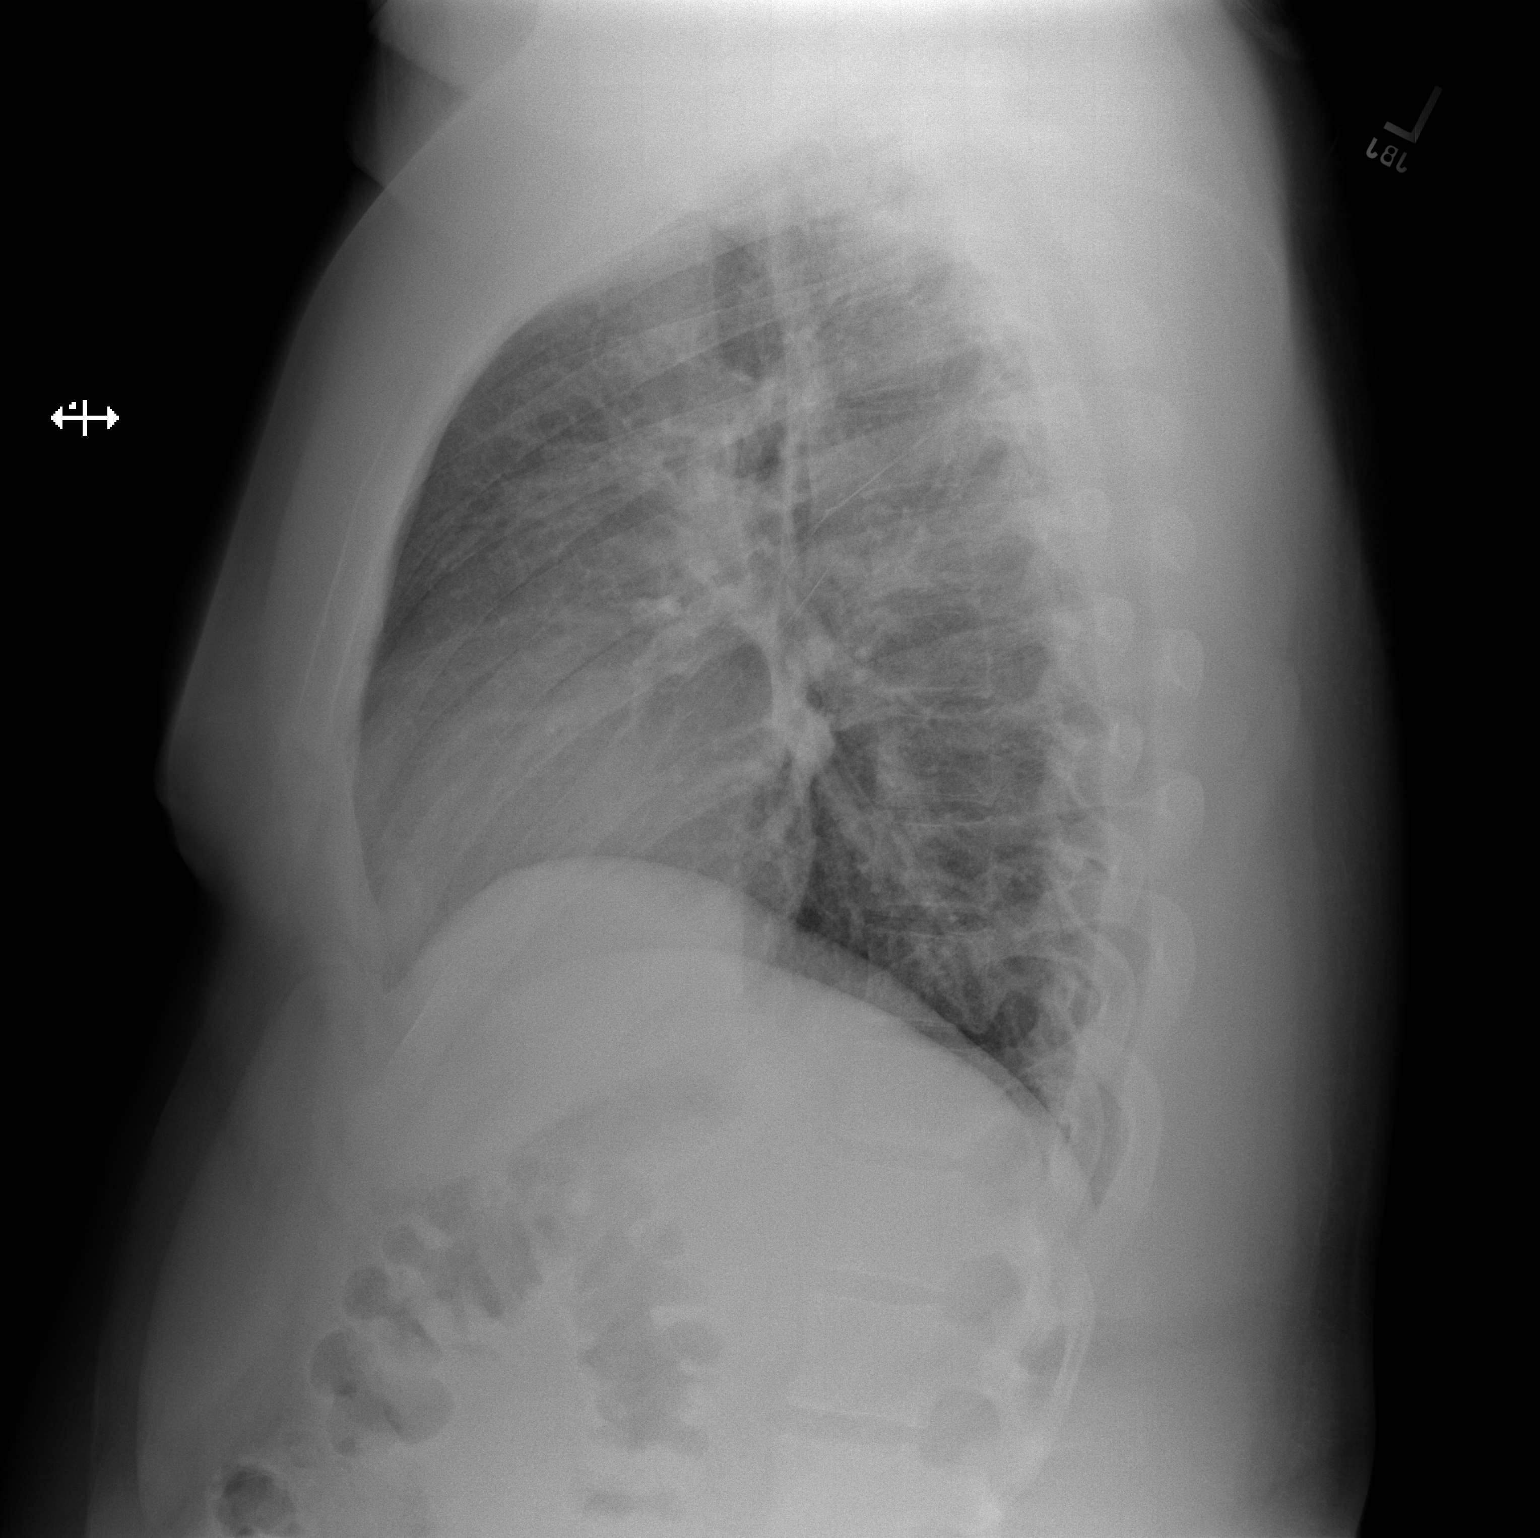

[2 of 2 positions shown; findings below may reference images not displayed]

FINDINGS: Mediastinum and hilar structures are normal. Low lung volumes with
mild right base atelectasis/infiltrate P No pleural effusion or
pneumothorax. Degenerative changes thoracic spine .
IMPRESSION: Low lung volumes with mild right base atelectasis/infiltrate. No
pleural effusion or pneumothorax.

## 2018-03-17 ENCOUNTER — Encounter: Payer: Self-pay | Admitting: Infectious Diseases

## 2018-03-17 ENCOUNTER — Other Ambulatory Visit: Payer: Self-pay | Admitting: *Deleted

## 2018-03-17 ENCOUNTER — Ambulatory Visit: Payer: Medicaid Other

## 2018-03-17 ENCOUNTER — Other Ambulatory Visit: Payer: Medicaid Other

## 2018-03-17 ENCOUNTER — Encounter: Payer: Self-pay | Admitting: *Deleted

## 2018-03-17 DIAGNOSIS — B20 Human immunodeficiency virus [HIV] disease: Secondary | ICD-10-CM

## 2018-03-17 DIAGNOSIS — Z113 Encounter for screening for infections with a predominantly sexual mode of transmission: Secondary | ICD-10-CM

## 2018-03-17 DIAGNOSIS — Z79899 Other long term (current) drug therapy: Secondary | ICD-10-CM

## 2018-03-17 MED ORDER — ELVITEG-COBIC-EMTRICIT-TENOFAF 150-150-200-10 MG PO TABS: 1.0000 | ORAL_TABLET | Freq: Every day | ORAL | 1 refills | Status: DC

## 2018-03-17 NOTE — Addendum Note (Signed)
Addended byJimmy Picket F on: 03/17/2018 09:53 AM   Modules accepted: Orders

## 2018-03-17 NOTE — Progress Notes (Signed)
Patient walked in to reestablish care. Asks for a letter stating he is HIV positive.  Done. Financial counseling, medication assistance, labs and follow up appointments made.  Patient saw [facility]. Will follow up with Dr Luciana Axe. Andree Coss, RN

## 2018-03-18 ENCOUNTER — Telehealth: Payer: Self-pay | Admitting: Behavioral Health

## 2018-03-18 LAB — T-HELPER CELL (CD4) - (RCID CLINIC ONLY)
CD4 % Helper T Cell: 29 % — ABNORMAL LOW (ref 33–55)
CD4 T Cell Abs: 1150 /uL (ref 400–2700)

## 2018-03-18 LAB — URINE CYTOLOGY ANCILLARY ONLY
CHLAMYDIA, DNA PROBE: NEGATIVE
NEISSERIA GONORRHEA: NEGATIVE

## 2018-03-18 NOTE — Telephone Encounter (Signed)
Called patient, verified identity.  Informed him per Dr. Luciana Axe he is positive for Syphilis and will need to come in to be treated Bicillin 2.4 million unitsx1 IM.  Patient verified that he has no known drug allergies.  Informed to abstain from sexual activity and to alert recent partners to be tested and treated at the local health department.  Nurse visit scheduled for tomorrow 03/19/2018 for Bicillin injection. Angeline Slim RN

## 2018-03-18 NOTE — Telephone Encounter (Signed)
-----   Message from Gardiner Barefoot, MD sent at 03/18/2018 12:15 PM EDT ----- New positive syphilis.  Needs Bicillin 2.4 million units x 1 thanks

## 2018-03-19 ENCOUNTER — Ambulatory Visit (INDEPENDENT_AMBULATORY_CARE_PROVIDER_SITE_OTHER): Payer: Self-pay | Admitting: *Deleted

## 2018-03-19 DIAGNOSIS — A64 Unspecified sexually transmitted disease: Secondary | ICD-10-CM

## 2018-03-19 MED ORDER — PENICILLIN G BENZATHINE 1200000 UNIT/2ML IM SUSP
1.2000 10*6.[IU] | Freq: Once | INTRAMUSCULAR | Status: AC
  Administered 2018-03-19: 1.2 10*6.[IU] via INTRAMUSCULAR

## 2018-03-20 ENCOUNTER — Other Ambulatory Visit: Payer: Self-pay | Admitting: *Deleted

## 2018-03-20 DIAGNOSIS — B20 Human immunodeficiency virus [HIV] disease: Secondary | ICD-10-CM

## 2018-03-20 MED ORDER — ELVITEG-COBIC-EMTRICIT-TENOFAF 150-150-200-10 MG PO TABS: 1.0000 | ORAL_TABLET | Freq: Every day | ORAL | 1 refills | Status: DC

## 2018-03-25 LAB — CBC WITH DIFFERENTIAL/PLATELET
BASOS ABS: 20 {cells}/uL (ref 0–200)
Basophils Relative: 0.4 %
EOS ABS: 100 {cells}/uL (ref 15–500)
Eosinophils Relative: 2 %
HCT: 45.1 % (ref 38.5–50.0)
Hemoglobin: 15.3 g/dL (ref 13.2–17.1)
Lymphs Abs: 1740 cells/uL (ref 850–3900)
MCH: 26.7 pg — AB (ref 27.0–33.0)
MCHC: 33.9 g/dL (ref 32.0–36.0)
MCV: 78.8 fL — AB (ref 80.0–100.0)
MONOS PCT: 9.7 %
MPV: 10.7 fL (ref 7.5–12.5)
NEUTROS PCT: 53.1 %
Neutro Abs: 2655 cells/uL (ref 1500–7800)
PLATELETS: 252 10*3/uL (ref 140–400)
RBC: 5.72 10*6/uL (ref 4.20–5.80)
RDW: 13.4 % (ref 11.0–15.0)
TOTAL LYMPHOCYTE: 34.8 %
WBC: 5 10*3/uL (ref 3.8–10.8)
WBCMIX: 485 {cells}/uL (ref 200–950)

## 2018-03-25 LAB — COMPLETE METABOLIC PANEL WITH GFR
AG RATIO: 1.2 (calc) (ref 1.0–2.5)
ALKALINE PHOSPHATASE (APISO): 70 U/L (ref 40–115)
ALT: 27 U/L (ref 9–46)
AST: 21 U/L (ref 10–40)
Albumin: 4.2 g/dL (ref 3.6–5.1)
BILIRUBIN TOTAL: 0.5 mg/dL (ref 0.2–1.2)
BUN: 10 mg/dL (ref 7–25)
CHLORIDE: 101 mmol/L (ref 98–110)
CO2: 29 mmol/L (ref 20–32)
Calcium: 9.3 mg/dL (ref 8.6–10.3)
Creat: 0.88 mg/dL (ref 0.60–1.35)
GFR, EST AFRICAN AMERICAN: 141 mL/min/{1.73_m2} (ref >=60)
GFR, Est Non African American: 122 mL/min/{1.73_m2} (ref >=60)
GLUCOSE: 88 mg/dL (ref 65–99)
Globulin: 3.4 g/dL (calc) (ref 1.9–3.7)
POTASSIUM: 4.1 mmol/L (ref 3.5–5.3)
Sodium: 137 mmol/L (ref 135–146)
Total Protein: 7.6 g/dL (ref 6.1–8.1)

## 2018-03-25 LAB — HIV-1 INTEGRASE GENOTYPE

## 2018-03-25 LAB — LIPID PANEL
CHOLESTEROL: 287 mg/dL — AB (ref <200)
HDL: 46 mg/dL (ref >40)
LDL CHOLESTEROL (CALC): 188 mg/dL — AB
Non-HDL Cholesterol (Calc): 241 mg/dL (calc) — ABNORMAL HIGH (ref <130)
TRIGLYCERIDES: 310 mg/dL — AB (ref <150)
Total CHOL/HDL Ratio: 6.2 (calc) — ABNORMAL HIGH (ref <5.0)

## 2018-03-25 LAB — HIV-1 RNA QUANT-NO REFLEX-BLD
HIV 1 RNA QUANT: 24800 {copies}/mL — AB
HIV-1 RNA QUANT, LOG: 4.39 {Log_copies}/mL — AB

## 2018-03-25 LAB — FLUORESCENT TREPONEMAL AB(FTA)-IGG-BLD: FLUORESCENT TREPONEMAL ABS: REACTIVE — AB

## 2018-03-25 LAB — RPR: RPR: REACTIVE — AB

## 2018-03-25 LAB — RPR TITER

## 2018-03-31 ENCOUNTER — Encounter: Payer: Self-pay | Admitting: Infectious Diseases

## 2018-03-31 ENCOUNTER — Ambulatory Visit (INDEPENDENT_AMBULATORY_CARE_PROVIDER_SITE_OTHER): Payer: Self-pay | Admitting: Infectious Diseases

## 2018-03-31 VITALS — BP 135/90 | HR 77 | Temp 98.5°F | Wt 302.0 lb

## 2018-03-31 DIAGNOSIS — A539 Syphilis, unspecified: Secondary | ICD-10-CM

## 2018-03-31 DIAGNOSIS — Z113 Encounter for screening for infections with a predominantly sexual mode of transmission: Secondary | ICD-10-CM

## 2018-03-31 DIAGNOSIS — B20 Human immunodeficiency virus [HIV] disease: Secondary | ICD-10-CM

## 2018-03-31 DIAGNOSIS — Z79899 Other long term (current) drug therapy: Secondary | ICD-10-CM

## 2018-03-31 NOTE — Assessment & Plan Note (Signed)
Encouraged him to diet and exercise.  

## 2018-03-31 NOTE — Progress Notes (Signed)
   Subjective:    Patient ID: Seth Taylor, male    DOB: [birth year], 22 y.o.   MRN: [id number]  HPI 22 yo M with HIV+ (and syphilis) dx with STI screening 06-2014. He was started on genvoya.  He was recently retreated for syphilis (this month) and has had persistent rash.  Has not been sexually active since.  Has been having joint aches as well.  C/o new rash on legs, stomach.  No problems with genvoya.   HIV 1 RNA Quant (copies/mL)  Date Value  03/17/2018 24,800 (H)  11/03/2015 <20  05/11/2015 <20   CD4 T Cell Abs (/uL)  Date Value  03/17/2018 1,150  10/22/2016 830  11/03/2015 660    Review of Systems  Constitutional: Negative for appetite change and unexpected weight change.  Gastrointestinal: Negative for constipation and diarrhea.  Genitourinary: Negative for difficulty urinating.  Skin: Positive for rash.  Please see HPI. All other systems reviewed and negative.      Objective:   Physical Exam  Constitutional: He is oriented to person, place, and time. He appears well-developed and well-nourished.  HENT:  Mouth/Throat: No oropharyngeal exudate.  Eyes: Pupils are equal, round, and reactive to light. EOM are normal.  Neck: Normal range of motion. Neck supple.  Cardiovascular: Normal rate, regular rhythm and normal heart sounds.  Pulmonary/Chest: Effort normal and breath sounds normal.  Abdominal: Soft. Bowel sounds are normal. He exhibits no distension. There is no tenderness.  Lymphadenopathy:    He has no cervical adenopathy.  Neurological: He is alert and oriented to person, place, and time.  Skin: Rash noted.          Assessment & Plan:

## 2018-03-31 NOTE — Assessment & Plan Note (Signed)
Will have him seen by derm.  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  I am concerned he  Has either untreated or ADR.

## 2018-03-31 NOTE — Assessment & Plan Note (Signed)
He is doing well  Offered/refused condoms.  Refuses flu vax.  Admits to period of being off meds, due to medicaid lapse.  Will recheck today and add genotype.  rtc in 6 months

## 2018-04-01 ENCOUNTER — Telehealth: Payer: Self-pay | Admitting: *Deleted

## 2018-04-01 NOTE — Progress Notes (Signed)
Patient called.  Patient aware. Patient notified of message from [place] and coming for injection 04/02/18. tp

## 2018-04-01 NOTE — Telephone Encounter (Signed)
Patient called per note from Dr Ninetta Lights and advised that he needed to have 3 doses of Bicillian instead of the 1 he received 03/19/18. Patient is scheduled to come tomorrow 04/02/18 for bicillian.

## 2018-04-02 ENCOUNTER — Ambulatory Visit (INDEPENDENT_AMBULATORY_CARE_PROVIDER_SITE_OTHER): Payer: Self-pay

## 2018-04-02 DIAGNOSIS — A64 Unspecified sexually transmitted disease: Secondary | ICD-10-CM

## 2018-04-02 MED ORDER — PENICILLIN G BENZATHINE 1200000 UNIT/2ML IM SUSP
1.2000 10*6.[IU] | Freq: Once | INTRAMUSCULAR | Status: AC
  Administered 2018-04-02: 1.2 10*6.[IU] via INTRAMUSCULAR

## 2018-04-02 NOTE — Progress Notes (Signed)
Patient recieved 2/3 dose of Bicillin. Patient tolerated well. Offered condoms, and patient refused. Patient is scheduled for last dose next Wednesday 04/09/18 at 10 am.

## 2018-04-04 LAB — HIV-1 RNA ULTRAQUANT REFLEX TO GENTYP+
HIV 1 RNA Quant: 352 copies/mL — ABNORMAL HIGH
HIV-1 RNA QUANT, LOG: 2.55 {Log_copies}/mL — AB

## 2018-04-04 LAB — RPR TITER

## 2018-04-04 LAB — RPR: RPR: REACTIVE — AB

## 2018-04-04 LAB — FLUORESCENT TREPONEMAL AB(FTA)-IGG-BLD: FLUORESCENT TREPONEMAL ABS: REACTIVE — AB

## 2018-04-09 ENCOUNTER — Ambulatory Visit: Payer: Medicaid Other

## 2018-04-21 ENCOUNTER — Ambulatory Visit: Payer: Self-pay

## 2018-04-21 ENCOUNTER — Telehealth: Payer: Self-pay | Admitting: *Deleted

## 2018-04-21 NOTE — Telephone Encounter (Signed)
Patient did not complete his 3 IM bicillin injections due to transportation issues. Per Dr Drue Second, after reviewing his labs, patient needs to restart injections. He will come today, will have his sister bring him for the next 2 weeks to complete 3 weeks of 2.4 million units of bicillin IM injections. Andree Coss, RN

## 2018-04-28 ENCOUNTER — Other Ambulatory Visit: Payer: Self-pay | Admitting: Behavioral Health

## 2018-04-28 ENCOUNTER — Ambulatory Visit: Payer: Medicaid Other | Admitting: Behavioral Health

## 2018-04-28 DIAGNOSIS — A539 Syphilis, unspecified: Secondary | ICD-10-CM

## 2018-04-28 DIAGNOSIS — B20 Human immunodeficiency virus [HIV] disease: Secondary | ICD-10-CM

## 2018-04-28 DIAGNOSIS — A64 Unspecified sexually transmitted disease: Secondary | ICD-10-CM

## 2018-04-28 MED ORDER — ELVITEG-COBIC-EMTRICIT-TENOFAF 150-150-200-10 MG PO TABS: 1.0000 | ORAL_TABLET | Freq: Every day | ORAL | 4 refills | Status: DC

## 2018-04-28 MED ORDER — PENICILLIN G BENZATHINE 1200000 UNIT/2ML IM SUSP: 1.2000 10*6.[IU] | Freq: Once | INTRAMUSCULAR | Status: DC

## 2018-04-28 NOTE — Progress Notes (Signed)
Patient came in today to be retreated for Syphilis.  Patient did not complete previous Syphilis treatment and received Bicillin #1/3 today.  Patient tolerated well.  Patient given and accepted condoms today.  Patient states he has been abstaining from sexually activity and states he has not had any new partners.  Patient aware of upcoming appointments for future treatment. Angeline SlimAshley Hill RN

## 2018-05-05 ENCOUNTER — Telehealth: Payer: Self-pay

## 2018-05-05 ENCOUNTER — Ambulatory Visit (INDEPENDENT_AMBULATORY_CARE_PROVIDER_SITE_OTHER): Payer: Self-pay | Admitting: *Deleted

## 2018-05-05 DIAGNOSIS — A64 Unspecified sexually transmitted disease: Secondary | ICD-10-CM

## 2018-05-05 MED ORDER — PENICILLIN G BENZATHINE 1200000 UNIT/2ML IM SUSP
1.2000 10*6.[IU] | Freq: Once | INTRAMUSCULAR | Status: AC
  Administered 2018-05-05: 1.2 10*6.[IU] via INTRAMUSCULAR

## 2018-05-05 NOTE — Telephone Encounter (Signed)
Called patient to check on him after not showing up for appointment this morning at 10:00 am. Patient answered and explained that he was having difficulty getting transportation to his appointment but was currently waiting for his transportation to arrive, and promised to arrive about 12pm and is willing to wait in the lobby if he arrives later than anticipated. Lastly told patient to please notify us if anything else comes up.

## 2018-05-12 ENCOUNTER — Ambulatory Visit: Payer: Medicaid Other

## 2018-05-12 ENCOUNTER — Ambulatory Visit (INDEPENDENT_AMBULATORY_CARE_PROVIDER_SITE_OTHER): Payer: Self-pay

## 2018-05-12 DIAGNOSIS — A539 Syphilis, unspecified: Secondary | ICD-10-CM

## 2018-05-12 DIAGNOSIS — B20 Human immunodeficiency virus [HIV] disease: Secondary | ICD-10-CM

## 2018-05-12 MED ORDER — PENICILLIN G BENZATHINE 1200000 UNIT/2ML IM SUSP
1.2000 10*6.[IU] | Freq: Once | INTRAMUSCULAR | Status: AC
  Administered 2018-05-12: 1.2 10*6.[IU] via INTRAMUSCULAR

## 2018-05-27 ENCOUNTER — Telehealth: Payer: Self-pay | Admitting: *Deleted

## 2018-05-27 NOTE — Telephone Encounter (Signed)
Patient on viral load list, just had ADAP approved 12/5.  He has had difficulty getting to clinic due to transportation issues, is living temporarily with his sister Delice [place]. Patient needs case management services, RN would like to connect him to HidalgoJulie at [facility]. She can even [facility].   RN attempted to call patient, but his phone is not accepting calls at this time. RN spoke with his sister Delice Bisonara who offered to pass a message to Sayf. RN asked her to have him call nurse Marcelino DusterMichelle at his doctor's office.  RN will discuss case management referral when he calls back. Andree CossHowell, Kavontae Pritchard M, RN

## 2018-06-05 ENCOUNTER — Telehealth: Payer: Self-pay | Admitting: *Deleted

## 2018-06-05 ENCOUNTER — Other Ambulatory Visit: Payer: Medicaid Other

## 2018-06-05 DIAGNOSIS — R21 Rash and other nonspecific skin eruption: Secondary | ICD-10-CM

## 2018-06-05 DIAGNOSIS — B20 Human immunodeficiency virus [HIV] disease: Secondary | ICD-10-CM

## 2018-06-05 DIAGNOSIS — A539 Syphilis, unspecified: Secondary | ICD-10-CM

## 2018-06-05 NOTE — Telephone Encounter (Signed)
Patient has new phone number, demographics updated. He would like to discuss his persistent rash with Dr Ninetta LightsHatcher. He stated he has received bicillin injections, but the rash did not clear up. He also wants to know the status on his dermatology referral (will 'cc Johaura) and schedule [facility]/adap renewal with Olegario MessierKathy. Patient scheduled today for labs, following up with Dr Ninetta LightsHatcher and Financial counseling 1/2. Andree CossHowell, Audryna Wendt M, RN

## 2018-06-06 ENCOUNTER — Other Ambulatory Visit: Payer: Medicaid Other

## 2018-06-12 ENCOUNTER — Ambulatory Visit: Payer: Medicaid Other | Admitting: Infectious Diseases

## 2018-06-12 ENCOUNTER — Ambulatory Visit: Payer: Medicaid Other

## 2018-06-13 ENCOUNTER — Telehealth: Payer: Self-pay | Admitting: *Deleted

## 2018-06-13 NOTE — Telephone Encounter (Signed)
Patient having difficulty getting to clinic. He lives in [place], is interested in case management, potential transfer to [facility] locally. Referral made to [place] at [facility]. Andree Coss, RN

## 2018-06-13 NOTE — Telephone Encounter (Signed)
Thanks

## 2018-06-16 ENCOUNTER — Ambulatory Visit: Payer: Self-pay | Admitting: Infectious Diseases

## 2018-06-18 NOTE — Telephone Encounter (Signed)
Per Raynelle Fanning, patient declined referral for case management.

## 2018-06-24 ENCOUNTER — Other Ambulatory Visit: Payer: Medicaid Other

## 2018-06-24 ENCOUNTER — Ambulatory Visit: Payer: Medicaid Other

## 2018-07-09 ENCOUNTER — Other Ambulatory Visit: Payer: Self-pay

## 2018-07-09 ENCOUNTER — Encounter: Payer: Medicaid Other | Admitting: *Deleted

## 2018-07-09 ENCOUNTER — Telehealth: Payer: Self-pay

## 2018-07-09 DIAGNOSIS — B20 Human immunodeficiency virus [HIV] disease: Secondary | ICD-10-CM

## 2018-07-09 MED ORDER — ELVITEG-COBIC-EMTRICIT-TENOFAF 150-150-200-10 MG PO TABS: 1.0000 | ORAL_TABLET | Freq: Every day | ORAL | 0 refills | Status: DC

## 2018-07-09 NOTE — Telephone Encounter (Signed)
Patient called today to reschedule missed appointment for lab work as well as appointment with [facility]. Scheduled patient an appointment to come on 1/30 for lab work/ adap renewal. Sent in one refill of Genvoya to [facility] for patient. Transferred call to research for Misty Stanley, to reschedule research appointment. Lorenso Courier, [place]

## 2018-07-10 ENCOUNTER — Other Ambulatory Visit: Payer: Self-pay

## 2018-07-10 ENCOUNTER — Ambulatory Visit: Payer: Self-pay

## 2018-07-10 ENCOUNTER — Ambulatory Visit: Payer: Medicaid Other

## 2018-07-10 DIAGNOSIS — B20 Human immunodeficiency virus [HIV] disease: Secondary | ICD-10-CM

## 2018-07-11 ENCOUNTER — Emergency Department (HOSPITAL_COMMUNITY)
Admission: EM | Admit: 2018-07-11 | Discharge: 2018-07-11 | Disposition: A | Discharge status: Home / Self Care | Payer: Self-pay | Attending: Emergency Medicine | Admitting: Emergency Medicine

## 2018-07-11 ENCOUNTER — Encounter (HOSPITAL_COMMUNITY): Payer: Self-pay | Admitting: Emergency Medicine

## 2018-07-11 ENCOUNTER — Emergency Department (HOSPITAL_COMMUNITY): Payer: Self-pay

## 2018-07-11 DIAGNOSIS — Z87891 Personal history of nicotine dependence: Secondary | ICD-10-CM | POA: Insufficient documentation

## 2018-07-11 DIAGNOSIS — Z79899 Other long term (current) drug therapy: Secondary | ICD-10-CM | POA: Insufficient documentation

## 2018-07-11 DIAGNOSIS — R0789 Other chest pain: Secondary | ICD-10-CM | POA: Insufficient documentation

## 2018-07-11 DIAGNOSIS — Z830 Family history of human immunodeficiency virus [HIV] disease: Secondary | ICD-10-CM | POA: Insufficient documentation

## 2018-07-11 LAB — I-STAT TROPONIN, ED
Troponin i, poc: 0 ng/mL (ref 0.00–0.08)
Troponin i, poc: 0 ng/mL (ref 0.00–0.08)

## 2018-07-11 LAB — CBC WITH DIFFERENTIAL/PLATELET
Abs Immature Granulocytes: 0.01 10*3/uL (ref 0.00–0.07)
Basophils Absolute: 0 10*3/uL (ref 0.0–0.1)
Basophils Relative: 1 %
Eosinophils Absolute: 0.1 10*3/uL (ref 0.0–0.5)
Eosinophils Relative: 2 %
HEMATOCRIT: 47 % (ref 39.0–52.0)
Hemoglobin: 15 g/dL (ref 13.0–17.0)
Immature Granulocytes: 0 %
LYMPHS ABS: 1.8 10*3/uL (ref 0.7–4.0)
LYMPHS PCT: 42 %
MCH: 26.8 pg (ref 26.0–34.0)
MCHC: 31.9 g/dL (ref 30.0–36.0)
MCV: 83.9 fL (ref 80.0–100.0)
Monocytes Absolute: 0.5 10*3/uL (ref 0.1–1.0)
Monocytes Relative: 11 %
Neutro Abs: 2 10*3/uL (ref 1.7–7.7)
Neutrophils Relative %: 44 %
Platelets: 229 10*3/uL (ref 150–400)
RBC: 5.6 MIL/uL (ref 4.22–5.81)
RDW: 13.6 % (ref 11.5–15.5)
WBC: 4.4 10*3/uL (ref 4.0–10.5)
nRBC: 0 % (ref 0.0–0.2)

## 2018-07-11 LAB — BASIC METABOLIC PANEL
Anion gap: 9 (ref 5–15)
BUN: 14 mg/dL (ref 6–20)
CALCIUM: 9.1 mg/dL (ref 8.9–10.3)
CO2: 26 mmol/L (ref 22–32)
Chloride: 104 mmol/L (ref 98–111)
Creatinine, Ser: 0.82 mg/dL (ref 0.61–1.24)
GFR calc Af Amer: >60 mL/min (ref >60)
GFR calc non Af Amer: >60 mL/min (ref >60)
Glucose, Bld: 101 mg/dL — ABNORMAL HIGH (ref 70–99)
Potassium: 3.7 mmol/L (ref 3.5–5.1)
Sodium: 139 mmol/L (ref 135–145)

## 2018-07-11 LAB — HEPATIC FUNCTION PANEL
ALT: 25 U/L (ref 0–44)
AST: 26 U/L (ref 15–41)
Albumin: 4.1 g/dL (ref 3.5–5.0)
Alkaline Phosphatase: 78 U/L (ref 38–126)
Bilirubin, Direct: 0.1 mg/dL (ref 0.0–0.2)
Indirect Bilirubin: 0.8 mg/dL (ref 0.3–0.9)
Total Bilirubin: 0.9 mg/dL (ref 0.3–1.2)
Total Protein: 7.3 g/dL (ref 6.5–8.1)

## 2018-07-11 LAB — LIPASE, BLOOD: Lipase: 33 U/L (ref 11–51)

## 2018-07-11 MED ORDER — SODIUM CHLORIDE 0.9% FLUSH
3.0000 mL | Freq: Once | INTRAVENOUS | Status: AC
  Administered 2018-07-11: 3 mL via INTRAVENOUS

## 2018-07-11 MED ORDER — ALUM & MAG HYDROXIDE-SIMETH 200-200-20 MG/5ML PO SUSP
30.0000 mL | Freq: Once | ORAL | Status: AC
  Administered 2018-07-11: 30 mL via ORAL
  Filled 2018-07-11: qty 30

## 2018-07-11 MED ORDER — LIDOCAINE VISCOUS HCL 2 % MT SOLN
15.0000 mL | Freq: Once | OROMUCOSAL | Status: AC
  Administered 2018-07-11: 15 mL via ORAL
  Filled 2018-07-11: qty 15

## 2018-07-11 MED ORDER — FAMOTIDINE 20 MG PO TABS: 20.0000 mg | ORAL_TABLET | Freq: Two times a day (BID) | ORAL | 0 refills | Status: DC

## 2018-07-11 NOTE — ED Notes (Signed)
Patient transported to and from X-ray 

## 2018-07-11 NOTE — ED Provider Notes (Signed)
[facility]-EMERGENCY DEPT Provider Note   CSN: [id number] Arrival date & time: 07/11/18  1022     History   Chief Complaint Chief Complaint  Patient presents with  . Chest Pain    HPI [name] is a 23 y.o. male for evaluation of chest pain.  Patient states he was at his friend's house when he had acute onset epigastric/substernal chest pain.  Patient states it radiates to the right side of his chest.  It is intermittent, nothing makes it come and go.  No change with movement, inspiration, or activity.  He denies history of similar.  Patient states he smokes Black and mild occasionally, none recently.  He denies alcohol use.  He reports occasional marijuana use, was smoking marijuana before symptoms began.  He denies fevers, chills, sore throat, cough, shortness of breath, nausea, vomiting abdominal pain, urinary symptoms, normal bowel movements.  Patient has a history of HIV for which he takes medication, his viral load is undetectable and it is well-managed at this time.  He denies significant NSAID use.  He does report a history of GERD, but does not take anything for this.  He denies a family history of young MIs.  Denies history of diabetes or hypertension.  HPI  Past Medical History:  Diagnosis Date  . HIV disease (HCC)   . Mononucleosis   . Syphilis     Patient Active Problem List   Diagnosis Date Noted  . Morbid obesity (HCC) 03/31/2018  . HIV disease (HCC) 07/08/2014  . Syphilis 07/08/2014  . Gonorrhea 07/08/2014  . Hypertriglyceridemia 07/08/2014    History reviewed. No pertinent surgical history.      Home Medications    Prior to Admission medications   Medication Sig Start Date End Date Taking? Authorizing Provider  elvitegravir-cobicistat-emtricitabine-tenofovir (GENVOYA) 150-150-200-10 MG TABS tablet Take 1 tablet by mouth daily with breakfast. 07/09/18  Yes Ginnie SmartHatcher, Jeffrey C, MD  ibuprofen (ADVIL,MOTRIN) 600 MG tablet Take 1  tablet (600 mg total) by mouth every 6 (six) hours as needed. Patient not taking: Reported on 07/11/2018 02/18/18   Hedges, Tinnie GensJeffrey, PA-C  predniSONE (STERAPRED UNI-PAK 21 TAB) 10 MG (21) TBPK tablet Take 6 pills on day one then decrease by 1 pill each day Patient not taking: Reported on 03/31/2018 09/26/17   Faythe GheeFisher, Susan W, PA-C    Family History Family History  Problem Relation Age of Onset  . CVA Mother        seconday to drug use  . Diabetes Father   . Hepatitis C Father     Social History Social History   Tobacco Use  . Smoking status: Former Games developermoker  . Smokeless tobacco: Never Used  Substance Use Topics  . Alcohol use: Yes    Alcohol/week: 0.0 standard drinks    Comment: "on occassion"  . Drug use: Yes    Types: Marijuana     Allergies   Patient has no known allergies.   Review of Systems Review of Systems  Cardiovascular: Positive for chest pain.  Gastrointestinal: Positive for abdominal pain (epigastric).  All other systems reviewed and are negative.    Physical Exam Updated Vital Signs BP (!) 149/99   Pulse (!) 53   Temp 98.4 F (36.9 C) (Oral)   Resp 16   SpO2 98%   Physical Exam Vitals signs and nursing note reviewed.  Constitutional:      General: He is not in acute distress.    Appearance: He is well-developed.  Comments: Sitting in the bed in NAD  HENT:     Head: Normocephalic and atraumatic.     Comments: Appears nontoxic Eyes:     Conjunctiva/sclera: Conjunctivae normal.     Pupils: Pupils are equal, round, and reactive to light.  Neck:     Musculoskeletal: Normal range of motion and neck supple.  Cardiovascular:     Rate and Rhythm: Normal rate and regular rhythm.     Pulses: Normal pulses.  Pulmonary:     Effort: Pulmonary effort is normal. No respiratory distress.     Breath sounds: Normal breath sounds. No wheezing.     Comments: Speaking in full sentences. Clear lung sounds in all fields.  Abdominal:     General: There is  no distension.     Palpations: Abdomen is soft. There is no mass.     Tenderness: There is no abdominal tenderness. There is no guarding or rebound.     Comments: Tenderness to palpation of epigastric and sternal chest.  Musculoskeletal: Normal range of motion.  Skin:    General: Skin is warm and dry.     Capillary Refill: Capillary refill takes less than 2 seconds.  Neurological:     Mental Status: He is alert and oriented to person, place, and time.      ED Treatments / Results  Labs (all labs ordered are listed, but only abnormal results are displayed) Labs Reviewed  BASIC METABOLIC PANEL - Abnormal; Notable for the following components:      Result Value   Glucose, Bld 101 (*)    All other components within normal limits  LIPASE, BLOOD  CBC WITH DIFFERENTIAL/PLATELET  HEPATIC FUNCTION PANEL  I-STAT TROPONIN, ED  I-STAT TROPONIN, ED    EKG None  Radiology Dg Chest 2 View  Result Date: 07/11/2018 CLINICAL DATA:  Chest pain. EXAM: CHEST - 2 VIEW COMPARISON:  02/18/2018. FINDINGS: Mediastinum hilar structures normal. Heart size normal. Low lung volumes with mild bibasilar atelectasis. Lungs are clear of acute infiltrates. No pleural effusion or pneumothorax. IMPRESSION: Low lung volumes with mild bibasilar atelectasis. Electronically Signed   By: Maisie Fushomas  Register   On: 07/11/2018 11:36    Procedures Procedures (including critical care time)  Medications Ordered in ED Medications  sodium chloride flush (NS) 0.9 % injection 3 mL (3 mLs Intravenous Given 07/11/18 1110)  alum & mag hydroxide-simeth (MAALOX/MYLANTA) 200-200-20 MG/5ML suspension 30 mL (30 mLs Oral Given 07/11/18 1154)    And  lidocaine (XYLOCAINE) 2 % viscous mouth solution 15 mL (15 mLs Oral Given 07/11/18 1154)     Initial Impression / Assessment and Plan / ED Course  I have reviewed the triage vital signs and the nursing notes.  Pertinent labs & imaging results that were available during my care of the  patient were reviewed by me and considered in my medical decision making (see chart for details).     Pt presenting for evaluation of chest pain.  Physical exam reassuring, he is afebrile not tachycardic and appears nontoxic.  He is epigastric and referring to his chest.  Additionally, tenderness palpation with his epigastrium and chest wall.  As such, likely GI source.  However, will obtain cardiac work-up and chest x-ray to rule out infection or concerning cardiac markers.  Labs reassuring, no leukocytosis.  Kidney, liver, pancreatic function reassuring.  No signs of gallbladder infection at this time.  Initial troponin reassuring.  EKG with early re-pole, but no obvious STEMI.  Chest x-ray viewed interpreted by  me, no pneumonia, no thorax, effusion, cardiomegaly.  GI cocktail given for symptom control.  On reassessment, patient reports pain improved with GI cocktail.  Discussed that his pain is new onset, will obtain repeat troponin reassess.  Delta troponin negative.  Patient with a heart score 3, low risk.  Discussed with patient.  Discussed follow-up with PCP for further evaluation.  Discussed possibility of GERD as cause of symptoms.  At this time, patient appears safe for discharge.  Return precautions given.  Patient states he understands and agrees to plan.   Final Clinical Impressions(s) / ED Diagnoses   Final diagnoses:  Atypical chest pain    ED Discharge Orders    None       Alveria Apley, PA-C 07/11/18 1458    Alvira Monday, MD 07/11/18 2221

## 2018-07-11 NOTE — ED Triage Notes (Signed)
Per pt, states central chest pain that started while he was at the table at his friend's house-states he wasn't doing anything

## 2018-07-11 NOTE — Discharge Instructions (Signed)
Your work-up today was reassuring from a cardiac standpoint.  You may be experiencing severe reflux, causing your chest pain. Call your primary care doctor before starting Pepcid to help with reflux control. There is information about food choices in the paperwork that will limit your symptoms. Follow-up with your primary care doctor for further evaluation of your symptoms.  Return to the emergency room with any new, worsening, concerning symptoms.

## 2018-07-15 ENCOUNTER — Ambulatory Visit (INDEPENDENT_AMBULATORY_CARE_PROVIDER_SITE_OTHER): Payer: Self-pay | Admitting: Infectious Diseases

## 2018-07-15 ENCOUNTER — Encounter: Payer: Self-pay | Admitting: Infectious Diseases

## 2018-07-15 VITALS — BP 114/73 | HR 80 | Temp 97.9°F | Wt 300.0 lb

## 2018-07-15 DIAGNOSIS — Z79899 Other long term (current) drug therapy: Secondary | ICD-10-CM

## 2018-07-15 DIAGNOSIS — Z113 Encounter for screening for infections with a predominantly sexual mode of transmission: Secondary | ICD-10-CM

## 2018-07-15 DIAGNOSIS — Z2821 Immunization not carried out because of patient refusal: Secondary | ICD-10-CM

## 2018-07-15 DIAGNOSIS — Z6841 Body Mass Index (BMI) 40.0 and over, adult: Secondary | ICD-10-CM

## 2018-07-15 DIAGNOSIS — A539 Syphilis, unspecified: Secondary | ICD-10-CM

## 2018-07-15 DIAGNOSIS — B20 Human immunodeficiency virus [HIV] disease: Secondary | ICD-10-CM

## 2018-07-15 NOTE — Assessment & Plan Note (Signed)
Will recheck his labs today Offered/refused condoms.  Refuses flu and PCV I offered for him to be eval by [facility] today for injectable study but he defers.  Will see him back in 3-4 months

## 2018-07-15 NOTE — Progress Notes (Signed)
   Subjective:    Patient ID: Seth Taylor, male    DOB: Apr 03, 1996, 23 y.o.   MRN: [id number]  HPI 23 yo M with HIV+ (and syphilis) dx with STI screening 06-2014. He was started on genvoya.  He was recently retreated for syphilis (03-2018). He was seen in ED this week for atypical CP. He believes they told him it was heartburn. He still has intermittent pain. Not awakening from sleep. Not after eating. Has had early in AM.  Feels like his rash has been worse sine he got PEN injections for syphilis.  He has derm appt on 09-16-18.  He denies missed rx until today. He ran out.    HIV 1 RNA Quant (copies/mL)  Date Value  03/31/2018 352 (H)  03/17/2018 24,800 (H)  11/03/2015 <20   CD4 T Cell Abs (/uL)  Date Value  03/17/2018 1,150  10/22/2016 830  11/03/2015 660    Review of Systems  Constitutional: Positive for unexpected weight change. Negative for chills and fever.  Cardiovascular: Positive for chest pain.  Gastrointestinal: Positive for abdominal pain. Negative for constipation and diarrhea.  Genitourinary: Negative for difficulty urinating.  Skin: Positive for rash.  Please see HPI. All other systems reviewed and negative.      Objective:   Physical Exam Constitutional:      Appearance: He is obese.  HENT:     Mouth/Throat:     Mouth: Mucous membranes are moist.     Pharynx: No oropharyngeal exudate.  Eyes:     Extraocular Movements: Extraocular movements intact.     Pupils: Pupils are equal, round, and reactive to light.  Neck:     Musculoskeletal: Normal range of motion and neck supple.  Cardiovascular:     Rate and Rhythm: Normal rate and regular rhythm.  Pulmonary:     Effort: Pulmonary effort is normal.     Breath sounds: Normal breath sounds.  Abdominal:     General: Bowel sounds are normal.     Palpations: Abdomen is soft.     Tenderness: There is no abdominal tenderness.  Musculoskeletal:        General: No swelling.  Skin:    Findings: Rash  present.  Neurological:     General: No focal deficit present.     Mental Status: He is alert.  Psychiatric:        Mood and Affect: Mood normal.        Assessment & Plan:

## 2018-07-15 NOTE — Assessment & Plan Note (Signed)
Will continue to encourage diet and exercise.

## 2018-07-15 NOTE — Assessment & Plan Note (Signed)
Will recheck his labs today.  Await derm eval for alternative explanation?

## 2018-07-16 LAB — T-HELPER CELL (CD4) - (RCID CLINIC ONLY)
CD4 % Helper T Cell: 29 % — ABNORMAL LOW (ref 33–55)
CD4 T CELL ABS: 530 /uL (ref 400–2700)

## 2018-07-18 ENCOUNTER — Encounter (HOSPITAL_COMMUNITY): Payer: Self-pay

## 2018-07-18 ENCOUNTER — Emergency Department (HOSPITAL_COMMUNITY)
Admission: EM | Admit: 2018-07-18 | Discharge: 2018-07-18 | Disposition: A | Discharge status: Home / Self Care | Payer: Medicaid Other | Attending: Emergency Medicine | Admitting: Emergency Medicine

## 2018-07-18 ENCOUNTER — Other Ambulatory Visit: Payer: Self-pay

## 2018-07-18 ENCOUNTER — Emergency Department (HOSPITAL_COMMUNITY): Payer: Medicaid Other

## 2018-07-18 DIAGNOSIS — R1011 Right upper quadrant pain: Secondary | ICD-10-CM | POA: Insufficient documentation

## 2018-07-18 DIAGNOSIS — Z87891 Personal history of nicotine dependence: Secondary | ICD-10-CM | POA: Insufficient documentation

## 2018-07-18 DIAGNOSIS — Z21 Asymptomatic human immunodeficiency virus [HIV] infection status: Secondary | ICD-10-CM | POA: Insufficient documentation

## 2018-07-18 DIAGNOSIS — R0789 Other chest pain: Secondary | ICD-10-CM | POA: Insufficient documentation

## 2018-07-18 DIAGNOSIS — Z79899 Other long term (current) drug therapy: Secondary | ICD-10-CM | POA: Insufficient documentation

## 2018-07-18 LAB — COMPREHENSIVE METABOLIC PANEL
ALT: 23 U/L (ref 0–44)
AST: 24 U/L (ref 15–41)
Albumin: 3.8 g/dL (ref 3.5–5.0)
Alkaline Phosphatase: 84 U/L (ref 38–126)
Anion gap: 11 (ref 5–15)
BUN: 10 mg/dL (ref 6–20)
CO2: 22 mmol/L (ref 22–32)
Calcium: 8.8 mg/dL — ABNORMAL LOW (ref 8.9–10.3)
Chloride: 106 mmol/L (ref 98–111)
Creatinine, Ser: 0.78 mg/dL (ref 0.61–1.24)
GFR calc Af Amer: >60 mL/min (ref >60)
GFR calc non Af Amer: >60 mL/min (ref >60)
Glucose, Bld: 120 mg/dL — ABNORMAL HIGH (ref 70–99)
Potassium: 4 mmol/L (ref 3.5–5.1)
Sodium: 139 mmol/L (ref 135–145)
Total Bilirubin: 0.6 mg/dL (ref 0.3–1.2)
Total Protein: 7.2 g/dL (ref 6.5–8.1)

## 2018-07-18 LAB — CBC WITH DIFFERENTIAL/PLATELET
Abs Immature Granulocytes: 0.02 10*3/uL (ref 0.00–0.07)
Basophils Absolute: 0 10*3/uL (ref 0.0–0.1)
Basophils Relative: 0 %
Eosinophils Absolute: 0.1 10*3/uL (ref 0.0–0.5)
Eosinophils Relative: 2 %
HCT: 45.7 % (ref 39.0–52.0)
Hemoglobin: 14.8 g/dL (ref 13.0–17.0)
Immature Granulocytes: 0 %
Lymphocytes Relative: 42 %
Lymphs Abs: 2.2 10*3/uL (ref 0.7–4.0)
MCH: 26.4 pg (ref 26.0–34.0)
MCHC: 32.4 g/dL (ref 30.0–36.0)
MCV: 81.6 fL (ref 80.0–100.0)
Monocytes Absolute: 0.4 10*3/uL (ref 0.1–1.0)
Monocytes Relative: 8 %
Neutro Abs: 2.5 10*3/uL (ref 1.7–7.7)
Neutrophils Relative %: 48 %
Platelets: 251 10*3/uL (ref 150–400)
RBC: 5.6 MIL/uL (ref 4.22–5.81)
RDW: 13.5 % (ref 11.5–15.5)
WBC: 5.2 10*3/uL (ref 4.0–10.5)
nRBC: 0 % (ref 0.0–0.2)

## 2018-07-18 LAB — LIPASE, BLOOD: LIPASE: 29 U/L (ref 11–51)

## 2018-07-18 LAB — I-STAT TROPONIN, ED: Troponin i, poc: 0 ng/mL (ref 0.00–0.08)

## 2018-07-18 NOTE — ED Provider Notes (Signed)
[facility] EMERGENCY DEPARTMENT Provider Note   CSN: [id number] Arrival date & time: 07/18/18  1725     History   Chief Complaint Chief Complaint  Patient presents with  . Chest Pain    HPI Seth Taylor is a 23 y.o. male with a past medical history of HIV, syphilis, obesity, who presents today for evaluation of right anterior chest pain.  He reports that he has had this pain intermittently over the past week.  He was seen for on 07/11/2018 and discharged home.  He states that he has been pain-free until today when he ate a [employer] peanut butter cup and drank soda and then within 10 minutes he started having central to right anterior chest pain.  He denies any nausea vomiting or diarrhea.  He denies any abdominal pain.  Pain does not move or radiate.  His pain fully relieved after he was given nitro by EMS in route.  He denies any recent leg swelling.  No history of DVT.  No recent hemoptysis, surgeries, or immobilizations.  He was seen by ID on 07/15/2018.  Chart review shows viral load is not back yet, however he has previously been detectable.    HPI  Past Medical History:  Diagnosis Date  . HIV disease ([place])   . Mononucleosis   . Syphilis     Patient Active Problem List   Diagnosis Date Noted  . Morbid obesity ([place]) 03/31/2018  . HIV disease ([place]) 07/08/2014  . Syphilis 07/08/2014  . Gonorrhea 07/08/2014  . Hypertriglyceridemia 07/08/2014    History reviewed. No pertinent surgical history.      Home Medications    Prior to Admission medications   Medication Sig Start Date End Date Taking? Authorizing Provider  elvitegravir-cobicistat-emtricitabine-tenofovir (GENVOYA) 150-150-200-10 MG TABS tablet Take 1 tablet by mouth daily with breakfast. 07/09/18  Yes Campbell Riches, MD  famotidine (PEPCID) 20 MG tablet Take 1 tablet (20 mg total) by mouth 2 (two) times daily. Patient not taking: Reported on 07/18/2018 07/11/18   Caccavale, Sophia, PA-C    ibuprofen (ADVIL,MOTRIN) 600 MG tablet Take 1 tablet (600 mg total) by mouth every 6 (six) hours as needed. Patient not taking: Reported on 07/18/2018 02/18/18   Hedges, Dellis Filbert, PA-C  predniSONE (STERAPRED UNI-PAK 21 TAB) 10 MG (21) TBPK tablet Take 6 pills on day one then decrease by 1 pill each day Patient not taking: Reported on 07/18/2018 09/26/17   Versie Starks, PA-C    Family History Family History  Problem Relation Age of Onset  . CVA Mother        seconday to drug use  . Diabetes Father   . Hepatitis C Father     Social History Social History   Tobacco Use  . Smoking status: Former Research scientist (life sciences)  . Smokeless tobacco: Never Used  Substance Use Topics  . Alcohol use: Yes    Alcohol/week: 0.0 standard drinks    Comment: "on occassion"  . Drug use: Yes    Types: Marijuana     Allergies   Patient has no known allergies.   Review of Systems Review of Systems  Constitutional: Negative for chills and fever.  HENT: Negative for congestion.   Respiratory: Negative for chest tightness and shortness of breath.   Cardiovascular: Positive for chest pain. Negative for palpitations and leg swelling.  Gastrointestinal: Negative for abdominal pain, diarrhea, nausea and vomiting.  Musculoskeletal: Negative for back pain and neck pain.  Skin: Positive for rash (Chronic, unchanged. ).  Negative for color change.  All other systems reviewed and are negative.    Physical Exam Updated Vital Signs BP 115/65   Pulse 73   Temp 98.1 F (36.7 C) (Oral)   Resp 14   SpO2 98%   Physical Exam Vitals signs and nursing note reviewed.  Constitutional:      General: He is not in acute distress.    Appearance: He is well-developed.  HENT:     Head: Normocephalic and atraumatic.  Eyes:     Conjunctiva/sclera: Conjunctivae normal.  Neck:     Musculoskeletal: Normal range of motion and neck supple.     Vascular: No JVD.     Trachea: No tracheal deviation.  Cardiovascular:     Rate and  Rhythm: Normal rate and regular rhythm.     Pulses:          Radial pulses are 2+ on the right side and 2+ on the left side.       Dorsalis pedis pulses are 2+ on the right side and 2+ on the left side.       Posterior tibial pulses are 2+ on the right side and 2+ on the left side.     Heart sounds: Normal heart sounds. No murmur. No systolic murmur.  Pulmonary:     Effort: Pulmonary effort is normal. No respiratory distress.     Breath sounds: Normal breath sounds.  Chest:     Chest wall: Tenderness (Diffusely TTP over sternocostal ) present. No mass or crepitus.  Abdominal:     Palpations: Abdomen is soft. There is no mass.     Tenderness: There is abdominal tenderness.  Musculoskeletal:     Right lower leg: He exhibits no tenderness. No edema.     Left lower leg: He exhibits no tenderness. No edema.  Skin:    General: Skin is warm and dry.     Comments: Skin in between toes on left foot appears macerated without evidence of secondary infection.  Neurological:     General: No focal deficit present.     Mental Status: He is alert.  Psychiatric:        Mood and Affect: Mood normal.        Behavior: Behavior normal.      ED Treatments / Results  Labs (all labs ordered are listed, but only abnormal results are displayed) Labs Reviewed  COMPREHENSIVE METABOLIC PANEL - Abnormal; Notable for the following components:      Result Value   Glucose, Bld 120 (*)    Calcium 8.8 (*)    All other components within normal limits  LIPASE, BLOOD  CBC WITH DIFFERENTIAL/PLATELET  I-STAT TROPONIN, ED    EKG EKG Interpretation  Date/Time:  Friday July 18 2018 17:33:00 EST Ventricular Rate:  91 PR Interval:    QRS Duration: 93 QT Interval:  331 QTC Calculation: 408 R Axis:   56 Text Interpretation:  Sinus rhythm ST elev, probable normal early repol pattern No significant change since last tracing Confirmed by Julianne Rice [phone]) on 07/18/2018 7:51:37 PM   Radiology Dg  Chest 2 View  Result Date: 07/18/2018 CLINICAL DATA:  Intermittent RIGHT-side chest pain for 1 month, former smoker, history HIV EXAM: CHEST - 2 VIEW COMPARISON:  07/11/2018 FINDINGS: Normal heart size, mediastinal contours, and pulmonary vascularity. Lungs clear. No pleural effusion or pneumothorax. Bones unremarkable. Numerous EKG leads project over chest. IMPRESSION: No acute abnormalities. Electronically Signed   By: Crist Infante.D.  On: 07/18/2018 18:46    Procedures Procedures (including critical care time)  Medications Ordered in ED Medications - No data to display   Initial Impression / Assessment and Plan / ED Course  I have reviewed the triage vital signs and the nursing notes.  Pertinent labs & imaging results that were available during my care of the patient were reviewed by me and considered in my medical decision making (see chart for details).  Clinical Course as of Jul 20 1139  Fri Jul 18, 2018  2056 Patient reevaluated, he reports that all of his symptoms have resolved.  Requesting discharge at this time.   [EH]    Clinical Course User Index [EH] Lorin Glass, PA-C   Patient presents today for evaluation of chest pain.  His chest pain is right-sided.  He denies diaphoresis.  His pain started after he ate a [employer] peanut butter cup, a fried chicken sandwich, and drinks soda.  EKG is unchanged from priors, no acute abnormality.  Troponin is not elevated.  Labs are without significant hematologic or electrolyte derangement.  He is PERC negative.  Chest x-ray without acute abnormalities.  Do not suspect ACS, pericarditis, pneumothorax, pneumonia, or other serious cardiovascular cause for his pain.  On repeat evaluation his pain had fully resolved except for with palpation of the anterior chest.  Suspect that he has a component of musculoskeletal pain contributing.    Given that his pain is right-sided and started after he ate foods with high fat content suspect that  he may have gallbladder dysfunction, in addition to musculoskeletal pain, causing referred right-sided chest pain as he on initial exam had right upper quadrant tenderness to palpation.  His AST, ALT, and alk phos are not elevated.  He is also on Genvoya for his HIV which may increase risk of cholelithiasis.  I did offer him ultrasound to further evaluate this, however he declined.   He does have evidence of skin maceration between his toes.  Recommended keeping his feet dry, changing socks, wearing well ventilated footwear when possible.   I recommended he follow-up with his PCP as an outpatient for repeat evaluation.  Given information on appropriate diet to avoid high fatty foods.     Return precautions were discussed with patient who states their understanding.  At the time of discharge patient denied any unaddressed complaints or concerns.  Patient is agreeable for discharge home.   Final Clinical Impressions(s) / ED Diagnoses   Final diagnoses:  Atypical chest pain  RUQ pain    ED Discharge Orders    None       Ollen Gross 07/19/18 1148    Julianne Rice, MD 07/20/18 1540

## 2018-07-18 NOTE — ED Triage Notes (Signed)
Pt brought in by [facility] from home for intermittent right sided CP x1 month, pt states he was seen at [facility] a few days ago, states they were not able to find anything. Pt given 324mg  aspirin and x1 nitro PTA with complete relief of chest pain. Pt states pain started 5 min after eating a reeses and drinking a soda.

## 2018-07-23 LAB — CBC
HCT: 44.4 % (ref 38.5–50.0)
Hemoglobin: 14.8 g/dL (ref 13.2–17.1)
MCH: 27 pg (ref 27.0–33.0)
MCHC: 33.3 g/dL (ref 32.0–36.0)
MCV: 81 fL (ref 80.0–100.0)
MPV: 10.8 fL (ref 7.5–12.5)
Platelets: 245 10*3/uL (ref 140–400)
RBC: 5.48 10*6/uL (ref 4.20–5.80)
RDW: 13.9 % (ref 11.0–15.0)
WBC: 4.5 10*3/uL (ref 3.8–10.8)

## 2018-07-23 LAB — FLUORESCENT TREPONEMAL AB(FTA)-IGG-BLD: FLUORESCENT TREPONEMAL ABS: REACTIVE — AB

## 2018-07-23 LAB — LIPID PANEL
Cholesterol: 196 mg/dL (ref <200)
HDL: 42 mg/dL (ref >=40)
LDL Cholesterol (Calc): 117 mg/dL (calc) — ABNORMAL HIGH
Non-HDL Cholesterol (Calc): 154 mg/dL (calc) — ABNORMAL HIGH (ref <130)
Total CHOL/HDL Ratio: 4.7 (calc) (ref <5.0)
Triglycerides: 246 mg/dL — ABNORMAL HIGH (ref <150)

## 2018-07-23 LAB — RPR: RPR Ser Ql: REACTIVE — AB

## 2018-07-23 LAB — HIV-1 GENOTYPE: HIV-1 Genotype: DETECTED — AB

## 2018-07-23 LAB — HIV-1 RNA ULTRAQUANT REFLEX TO GENTYP+
HIV 1 RNA Quant: 18900 copies/mL — ABNORMAL HIGH
HIV-1 RNA Quant, Log: 4.28 Log copies/mL — ABNORMAL HIGH

## 2018-07-23 LAB — RPR TITER: RPR Titer: 1:32 {titer} — ABNORMAL HIGH

## 2018-07-30 ENCOUNTER — Encounter: Payer: Self-pay | Admitting: Infectious Diseases

## 2018-08-19 ENCOUNTER — Other Ambulatory Visit: Payer: Self-pay | Admitting: Infectious Diseases

## 2018-08-19 DIAGNOSIS — B20 Human immunodeficiency virus [HIV] disease: Secondary | ICD-10-CM

## 2018-09-30 ENCOUNTER — Ambulatory Visit: Payer: Self-pay | Admitting: Infectious Diseases

## 2018-10-02 ENCOUNTER — Ambulatory Visit: Payer: Medicaid Other | Admitting: Infectious Diseases

## 2018-11-21 ENCOUNTER — Emergency Department (HOSPITAL_COMMUNITY)
Admission: EM | Admit: 2018-11-21 | Discharge: 2018-11-21 | Disposition: A | Discharge status: Home / Self Care | Payer: Self-pay | Attending: Emergency Medicine | Admitting: Emergency Medicine

## 2018-11-21 ENCOUNTER — Encounter (HOSPITAL_COMMUNITY): Payer: Self-pay | Admitting: Emergency Medicine

## 2018-11-21 ENCOUNTER — Other Ambulatory Visit: Payer: Self-pay

## 2018-11-21 ENCOUNTER — Emergency Department (HOSPITAL_COMMUNITY): Payer: Self-pay

## 2018-11-21 DIAGNOSIS — M79661 Pain in right lower leg: Secondary | ICD-10-CM

## 2018-11-21 DIAGNOSIS — M25561 Pain in right knee: Secondary | ICD-10-CM | POA: Insufficient documentation

## 2018-11-21 DIAGNOSIS — Z830 Family history of human immunodeficiency virus [HIV] disease: Secondary | ICD-10-CM | POA: Insufficient documentation

## 2018-11-21 DIAGNOSIS — Z79899 Other long term (current) drug therapy: Secondary | ICD-10-CM | POA: Insufficient documentation

## 2018-11-21 DIAGNOSIS — Z87891 Personal history of nicotine dependence: Secondary | ICD-10-CM | POA: Insufficient documentation

## 2018-11-21 MED ORDER — CEPHALEXIN 500 MG PO CAPS: 1000.0000 mg | ORAL_CAPSULE | Freq: Two times a day (BID) | ORAL | 0 refills | Status: DC

## 2018-11-21 MED ORDER — TRAMADOL-ACETAMINOPHEN 37.5-325 MG PO TABS: 1.0000 | ORAL_TABLET | Freq: Four times a day (QID) | ORAL | 0 refills | Status: DC | PRN

## 2018-11-21 MED ORDER — IBUPROFEN 800 MG PO TABS
800.0000 mg | ORAL_TABLET | Freq: Once | ORAL | Status: AC
  Administered 2018-11-21: 800 mg via ORAL
  Filled 2018-11-21: qty 1

## 2018-11-21 NOTE — ED Provider Notes (Signed)
[facility]-EMERGENCY DEPT Provider Note   CSN: [id number] Arrival date & time: 11/21/18  1603    History   Chief Complaint Chief Complaint  Patient presents with  . Knee Pain    HPI Seth Taylor is a 23 y.o. male.     HPI Patient reports that he started developing some pain in his right lower leg yesterday.  He has an area that is below the knee on the anterior leg, he indicates that the pretibial surface of the proximal third of the lower leg.  He reports it was achy yesterday but now is really painful and if he puts weight on it it is severely painful.  No specific injury although he notes that yesterday while driving he had his leg kind of jammed up against the dashboard and thinks maybe it got a lot of pressure on that front part of the leg and now it hurts.  Has no other associated symptoms.  Fevers, no chills review of systems is negative.  Pain is not in the knee joint itself.  He feels that he can perceive a little bit of redness or swelling in the area. Past Medical History:  Diagnosis Date  . HIV disease (HCC)   . Mononucleosis   . Syphilis     Patient Active Problem List   Diagnosis Date Noted  . Morbid obesity (HCC) 03/31/2018  . HIV disease (HCC) 07/08/2014  . Syphilis 07/08/2014  . Gonorrhea 07/08/2014  . Hypertriglyceridemia 07/08/2014    History reviewed. No pertinent surgical history.      Home Medications    Prior to Admission medications   Medication Sig Start Date End Date Taking? Authorizing Provider  cephALEXin (KEFLEX) 500 MG capsule Take 2 capsules (1,000 mg total) by mouth 2 (two) times daily. 11/21/18   Arby BarrettePfeiffer, Yanely Mast, MD  famotidine (PEPCID) 20 MG tablet Take 1 tablet (20 mg total) by mouth 2 (two) times daily. Patient not taking: Reported on 07/18/2018 07/11/18   Caccavale, Sophia, PA-C  GENVOYA 150-150-200-10 MG TABS tablet TAKE 1 TABLET BY MOUTH DAILY WITH BREAKFAST 08/19/18   Ginnie SmartHatcher, Jeffrey C, MD  ibuprofen  (ADVIL,MOTRIN) 600 MG tablet Take 1 tablet (600 mg total) by mouth every 6 (six) hours as needed. Patient not taking: Reported on 07/18/2018 02/18/18   Hedges, Tinnie GensJeffrey, PA-C  predniSONE (STERAPRED UNI-PAK 21 TAB) 10 MG (21) TBPK tablet Take 6 pills on day one then decrease by 1 pill each day Patient not taking: Reported on 07/18/2018 09/26/17   Faythe GheeFisher, Susan W, PA-C  traMADol-acetaminophen (ULTRACET) 37.5-325 MG tablet Take 1-2 tablets by mouth every 6 (six) hours as needed. 11/21/18   Arby BarrettePfeiffer, Alistair Senft, MD    Family History Family History  Problem Relation Age of Onset  . CVA Mother        seconday to drug use  . Diabetes Father   . Hepatitis C Father     Social History Social History   Tobacco Use  . Smoking status: Former Games developermoker  . Smokeless tobacco: Never Used  Substance Use Topics  . Alcohol use: Yes    Alcohol/week: 0.0 standard drinks    Comment: "on occassion"  . Drug use: Yes    Types: Marijuana     Allergies   Patient has no known allergies.   Review of Systems Review of Systems 10 Systems reviewed and are negative for acute change except as noted in the HPI.   Physical Exam Updated Vital Signs BP 134/84 (BP Location: Left Arm)  Pulse 83   Temp 97.8 F (36.6 C) (Oral)   Resp 18   SpO2 100%   Physical Exam Constitutional:      Comments: Alert and clinically well in appearance.  Nontoxic.  No respiratory distress.  Pulmonary:     Effort: Pulmonary effort is normal.  Musculoskeletal: Normal range of motion.     Comments: There is no objective swelling of either lower leg.  No effusion at the knee joints.  Patient does not have tenderness palpation directly over the knee on the right.  He is tender over the pretibial lateral approximately 10 cm below the knee itself.  No tenderness of the calf or the popliteal fossa.  No edema of the leg.  I cannot objectively say there is erythema or swelling relative to the other side.  No crepitus.  Skin:    General: Skin is  warm and dry.  Neurological:     General: No focal deficit present.     Mental Status: He is oriented to person, place, and time.     Coordination: Coordination normal.  Psychiatric:        Mood and Affect: Mood normal.        ED Treatments / Results  Labs (all labs ordered are listed, but only abnormal results are displayed) Labs Reviewed - No data to display  EKG None  Radiology Dg Tibia/fibula Right  Result Date: 11/21/2018 CLINICAL DATA:  Pt c/o right anterior lower leg pains since yesterday and the pain is worse today. Pt reported that he can't put weight on his right leg. EXAM: RIGHT TIBIA AND FIBULA - 2 VIEW COMPARISON:  None. FINDINGS: There is no evidence of fracture or other focal bone lesions. Soft tissues are unremarkable. IMPRESSION: Negative. Electronically Signed   By: Van Clines M.D.   On: 11/21/2018 17:35    Procedures Procedures (including critical care time)  Medications Ordered in ED Medications  ibuprofen (ADVIL) tablet 800 mg (800 mg Oral Given 11/21/18 1705)     Initial Impression / Assessment and Plan / ED Course  I have reviewed the triage vital signs and the nursing notes.  Pertinent labs & imaging results that were available during my care of the patient were reviewed by me and considered in my medical decision making (see chart for details).       Patient does not have any symptoms in the knee joint itself.  He reports soft tissue discomfort and a focal area about the size of his hand over the upper lateral pretibial surface of the right lower leg.  It is subtle but possible mild erythema and swelling coupled with pain per the patient suggests cellulitis.  Since this does seem to be very much soft tissue, will opt to empirically treat with Keflex and instructed on elevating and icing.  Turn precautions reviewed.  Patient has no other associated symptoms on review of systems.  Final Clinical Impressions(s) / ED Diagnoses   Final  diagnoses:  Pain of right lower leg    ED Discharge Orders         Ordered    cephALEXin (KEFLEX) 500 MG capsule  2 times daily     11/21/18 1744    traMADol-acetaminophen (ULTRACET) 37.5-325 MG tablet  Every 6 hours PRN     11/21/18 1744           Charlesetta Shanks, MD 11/21/18 1750

## 2018-11-21 NOTE — ED Triage Notes (Addendum)
Pt c/o lower knee pains that started this morning when woke up. Reports that he cant put wait on his right leg. Denies injuries.

## 2018-11-21 NOTE — ED Notes (Signed)
Bed: WTR9 Expected date:  Expected time:  Means of arrival:  Comments: 

## 2018-11-21 NOTE — Discharge Instructions (Signed)
1.  Elevate your leg as much as possible. 2.  You may apply a cool ice pack for comfort. 3.  Take Keflex and Ultracet if needed for pain.  The Ultracet does have Tylenol in each tablet.  Do not take additional Tylenol.  If you choose to use Tylenol only then discontinue the Ultracet. 4.  Have a recheck within the next 2 days by your doctor. 5.  Return to the emergency department if you have worsening swelling, redness, fever or other concerns.

## 2019-01-22 ENCOUNTER — Ambulatory Visit: Payer: Medicaid Other | Admitting: Pharmacist

## 2019-05-31 ENCOUNTER — Other Ambulatory Visit: Payer: Self-pay

## 2019-05-31 ENCOUNTER — Emergency Department (HOSPITAL_COMMUNITY)
Admission: EM | Admit: 2019-05-31 | Discharge: 2019-05-31 | Disposition: A | Discharge status: Home / Self Care | Payer: Medicaid Other | Attending: Emergency Medicine | Admitting: Emergency Medicine

## 2019-05-31 ENCOUNTER — Encounter (HOSPITAL_COMMUNITY): Payer: Self-pay | Admitting: Emergency Medicine

## 2019-05-31 DIAGNOSIS — Z5321 Procedure and treatment not carried out due to patient leaving prior to being seen by health care provider: Secondary | ICD-10-CM | POA: Insufficient documentation

## 2019-05-31 DIAGNOSIS — R1110 Vomiting, unspecified: Secondary | ICD-10-CM | POA: Insufficient documentation

## 2019-05-31 DIAGNOSIS — R1013 Epigastric pain: Secondary | ICD-10-CM | POA: Insufficient documentation

## 2019-05-31 MED ORDER — SODIUM CHLORIDE 0.9% FLUSH: 3.0000 mL | Freq: Once | INTRAVENOUS | Status: DC

## 2019-05-31 NOTE — ED Notes (Signed)
Called pt to draw labs. Pt stated "im honestly bout to leave, my rides outside". Pt confirmed he does not want to be seen.

## 2019-05-31 NOTE — ED Notes (Signed)
No response in waiting area for vitals

## 2019-05-31 NOTE — ED Notes (Signed)
Pt in triage room yelling that he needs assistance.  Asked pt what he needs and he says again he needs assistance.  Asked pt what kind of assistance and he states he needs water. Explained to pt that he cannot have water because he has been vomiting and will have to wait to see the doctor.  Pt states, "I'm not f--king vomiting and I need some water." Explained again to pt that he told me on arrival that he has been vomiting and he now denies vomiting and states, "I'm just spitting up."  States, "I'm not talking to you I need the F--king nurse."  Explained that I was the nurse and encouraged pt to refrain from using that language.  Asked pt to wait in lobby and he states he isn't leaving the room until "they" tell him to.  EMT told pt to wait in lobby and he left triage room cursing.

## 2019-05-31 NOTE — ED Triage Notes (Signed)
C/o epigastric pain and vomiting since this afternoon. Denies diarrhea.

## 2019-06-16 ENCOUNTER — Encounter: Payer: Self-pay | Admitting: Infectious Diseases

## 2019-06-16 NOTE — Progress Notes (Signed)
Patient ID: Seth Taylor, male   DOB: 23-Feb-1996, 24 y.o.   MRN: [id number] Working Viral Load List   Called patient to schedule  Voice mail not set up

## 2019-08-16 ENCOUNTER — Other Ambulatory Visit: Payer: Self-pay

## 2019-08-16 ENCOUNTER — Encounter (HOSPITAL_COMMUNITY): Payer: Self-pay | Admitting: Emergency Medicine

## 2019-08-16 ENCOUNTER — Ambulatory Visit (HOSPITAL_COMMUNITY)
Admission: EM | Admit: 2019-08-16 | Discharge: 2019-08-16 | Disposition: A | Discharge status: Home / Self Care | Payer: Medicaid Other | Attending: Family Medicine | Admitting: Family Medicine

## 2019-08-16 DIAGNOSIS — G8929 Other chronic pain: Secondary | ICD-10-CM

## 2019-08-16 DIAGNOSIS — M545 Low back pain, unspecified: Secondary | ICD-10-CM

## 2019-08-16 MED ORDER — CYCLOBENZAPRINE HCL 10 MG PO TABS: 10.0000 mg | ORAL_TABLET | Freq: Two times a day (BID) | ORAL | 0 refills | Status: DC | PRN

## 2019-08-16 MED ORDER — IBUPROFEN 600 MG PO TABS: 600.0000 mg | ORAL_TABLET | Freq: Four times a day (QID) | ORAL | 0 refills | Status: DC | PRN

## 2019-08-16 NOTE — ED Triage Notes (Signed)
Pt sts left sided lower back pain intermittently x months; denies new injury

## 2019-08-16 NOTE — Discharge Instructions (Signed)
Take the prescribed ibuprofen as needed for your pain.  Take the muscle relaxer Flexeril as needed for muscle spasm; do not drive, operate machinery, or drink alcohol with this medication as it may make you drowsy.    Follow up with an orthopedist or your primary care provider if your pain is not improving.  Go to the emergency department if you have worsening pain or develop new symptoms such as difficulty with urination, weakness, numbness, loss of control of your bladder or bowels, fever, chills or other concerns.   

## 2019-08-17 NOTE — ED Provider Notes (Signed)
[name]    CSN: [id number] Arrival date & time: 08/16/19  [place]      History   Chief Complaint Chief Complaint  Patient presents with  . Back Pain    HPI Seth Taylor is a 24 y.o. male.   Reports lower back pain today.  History significant for HIV.  Patient takes Jorje Guild, reports that he always takes his medication. Reports is been going on for months intermittently.  Reports he takes ibuprofen as needed.  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  Reports that he has tried no other treatments other than ibuprofen at home.  Denies headache, fever, cough, nausea, vomiting, diarrhea, radiating pain, chills, body aches, rash, or symptoms.  ROS per HPI  The history is provided by the patient.  Back Pain   Past Medical History:  Diagnosis Date  . HIV disease ([place])   . Mononucleosis   . Syphilis     Patient Active Problem List   Diagnosis Date Noted  . Morbid obesity ([place]) 03/31/2018  . HIV disease ([place]) 07/08/2014  . Syphilis 07/08/2014  . Gonorrhea 07/08/2014  . Hypertriglyceridemia 07/08/2014    History reviewed. No pertinent surgical history.     Home Medications    Prior to Admission medications   Medication Sig Start Date End Date Taking? Authorizing Provider  cephALEXin (KEFLEX) 500 MG capsule Take 2 capsules (1,000 mg total) by mouth 2 (two) times daily. Patient not taking: Reported on 08/16/2019 11/21/18   Charlesetta Shanks, MD  cyclobenzaprine (FLEXERIL) 10 MG tablet Take 1 tablet (10 mg total) by mouth 2 (two) times daily as needed for muscle spasms. 08/16/19   Faustino Congress, NP  famotidine (PEPCID) 20 MG tablet Take 1 tablet (20 mg total) by mouth 2 (two) times daily. Patient not taking: Reported on 07/18/2018 07/11/18   Caccavale, Sophia, PA-C  GENVOYA 150-150-200-10 MG TABS tablet TAKE 1 TABLET BY MOUTH DAILY WITH BREAKFAST 08/19/18   Campbell Riches, MD   ibuprofen (ADVIL) 600 MG tablet Take 1 tablet (600 mg total) by mouth every 6 (six) hours as needed. 08/16/19   Faustino Congress, NP  predniSONE (STERAPRED UNI-PAK 21 TAB) 10 MG (21) TBPK tablet Take 6 pills on day one then decrease by 1 pill each day Patient not taking: Reported on 07/18/2018 09/26/17   Versie Starks, PA-C  traMADol-acetaminophen (ULTRACET) 37.5-325 MG tablet Take 1-2 tablets by mouth every 6 (six) hours as needed. Patient not taking: Reported on 08/16/2019 11/21/18   Charlesetta Shanks, MD    Family History Family History  Problem Relation Age of Onset  . CVA Mother        seconday to drug use  . Diabetes Father   . Hepatitis C Father     Social History Social History   Tobacco Use  . Smoking status: Former Research scientist (life sciences)  . Smokeless tobacco: Never Used  Substance Use Topics  . Alcohol use: Yes    Alcohol/week: 0.0 standard drinks    Comment: "on occassion"  . Drug use: Yes    Types: Marijuana     Allergies   Patient has no known allergies.   Review of Systems Review of Systems  Musculoskeletal: Positive for back pain.     Physical Exam Triage Vital Signs ED Triage Vitals  Enc Vitals Group     BP 08/16/19 1814 126/85     Pulse Rate 08/16/19  1814 99     Resp 08/16/19 1814 18     Temp 08/16/19 1814 (!) 97.1 F (36.2 C)     Temp Source 08/16/19 1814 Oral     SpO2 08/16/19 1814 97 %     Weight --      Height --      Head Circumference --      Peak Flow --      Pain Score 08/16/19 1815 8     Pain Loc --      Pain Edu? --      Excl. in GC? --    No data found.  Updated Vital Signs BP 126/85 (BP Location: Right Arm)   Pulse 99   Temp (!) 97.1 F (36.2 C) (Oral)   Resp 18   SpO2 97%       Physical Exam Vitals and nursing note reviewed.  Constitutional:      General: He is not in acute distress.    Appearance: Normal appearance. He is well-developed. He is obese.  HENT:     Head: Normocephalic and atraumatic.  Eyes:      Conjunctiva/sclera: Conjunctivae normal.  Cardiovascular:     Rate and Rhythm: Normal rate and regular rhythm.     Heart sounds: Normal heart sounds. No murmur.  Pulmonary:     Effort: Pulmonary effort is normal. No respiratory distress.     Breath sounds: Normal breath sounds.  Abdominal:     General: Bowel sounds are normal. There is no distension.     Palpations: Abdomen is soft. There is no mass.     Tenderness: There is no abdominal tenderness.     Hernia: No hernia is present.  Musculoskeletal:        General: Normal range of motion.     Cervical back: Normal range of motion and neck supple.  Skin:    General: Skin is warm and dry.     Capillary Refill: Capillary refill takes less than 2 seconds.  Neurological:     General: No focal deficit present.     Mental Status: He is alert and oriented to person, place, and time.  Psychiatric:        Mood and Affect: Mood normal.        Behavior: Behavior normal.      UC Treatments / Results  Labs (all labs ordered are listed, but only abnormal results are displayed) Labs Reviewed - No data to display  EKG   Radiology No results found.  Procedures Procedures (including critical care time)  Medications Ordered in UC Medications - No data to display  Initial Impression / Assessment and Plan / UC Course  I have reviewed the triage vital signs and the nursing notes.  Pertinent labs & imaging results that were available during my care of the patient were reviewed by me and considered in my medical decision making (see chart for details).     Low back pain today.  No radiating pain, negative straight leg raise.  Discussed body mechanics, proper footwear on hard floors proper posture.  Prescribed ibuprofen 600 mg every 6 hours as needed for inflammation and pain, prescribed Flexeril as needed twice daily for muscle spasms.  Do not drive or operate heavy machinery as this medication can be sedating.  Follow-up with primary  care if symptoms are not improving or worsen.  Go to the ER if you experience unrelenting pain, loss of bowel or bladder habits, other concerning symptoms. Final Clinical  Impressions(s) / UC Diagnoses   Final diagnoses:  Chronic bilateral low back pain without sciatica     Discharge Instructions     Take the prescribed ibuprofen as needed for your pain.  Take the muscle relaxer Flexeril as needed for muscle spasm; do not drive, operate machinery, or drink alcohol with this medication as it may make you drowsy.    Follow up with an orthopedist or your primary care provider if your pain is not improving.  Go to the emergency department if you have worsening pain or develop new symptoms such as difficulty with urination, weakness, numbness, loss of control of your bladder or bowels, fever, chills or other concerns.    ED Prescriptions    Medication Sig Dispense Auth. Provider   cyclobenzaprine (FLEXERIL) 10 MG tablet Take 1 tablet (10 mg total) by mouth 2 (two) times daily as needed for muscle spasms. 20 tablet Moshe Cipro, NP   ibuprofen (ADVIL) 600 MG tablet Take 1 tablet (600 mg total) by mouth every 6 (six) hours as needed. 30 tablet Moshe Cipro, NP     PDMP not reviewed this encounter.   Moshe Cipro, NP 08/17/19 [phone]

## 2019-09-17 ENCOUNTER — Ambulatory Visit: Payer: Self-pay

## 2019-09-17 ENCOUNTER — Other Ambulatory Visit: Payer: Self-pay

## 2019-09-17 DIAGNOSIS — Z79899 Other long term (current) drug therapy: Secondary | ICD-10-CM

## 2019-09-17 DIAGNOSIS — B20 Human immunodeficiency virus [HIV] disease: Secondary | ICD-10-CM

## 2019-09-17 DIAGNOSIS — Z113 Encounter for screening for infections with a predominantly sexual mode of transmission: Secondary | ICD-10-CM

## 2019-09-18 LAB — URINE CYTOLOGY ANCILLARY ONLY
Chlamydia: NEGATIVE
Comment: NEGATIVE
Comment: NORMAL
Neisseria Gonorrhea: NEGATIVE

## 2019-09-18 LAB — T-HELPER CELL (CD4) - (RCID CLINIC ONLY)
CD4 % Helper T Cell: 27 % — ABNORMAL LOW (ref 33–65)
CD4 T Cell Abs: 441 /uL (ref 400–1790)

## 2019-09-19 LAB — CBC WITH DIFFERENTIAL/PLATELET
Absolute Monocytes: 554 cells/uL (ref 200–950)
Basophils Absolute: 21 cells/uL (ref 0–200)
Basophils Relative: 0.5 %
Eosinophils Absolute: 70 cells/uL (ref 15–500)
Eosinophils Relative: 1.7 %
HCT: 47.3 % (ref 38.5–50.0)
Hemoglobin: 15.8 g/dL (ref 13.2–17.1)
Lymphs Abs: 1685 cells/uL (ref 850–3900)
MCH: 26.6 pg — ABNORMAL LOW (ref 27.0–33.0)
MCHC: 33.4 g/dL (ref 32.0–36.0)
MCV: 79.5 fL — ABNORMAL LOW (ref 80.0–100.0)
MPV: 10.3 fL (ref 7.5–12.5)
Monocytes Relative: 13.5 %
Neutro Abs: 1771 cells/uL (ref 1500–7800)
Neutrophils Relative %: 43.2 %
Platelets: 251 10*3/uL (ref 140–400)
RBC: 5.95 10*6/uL — ABNORMAL HIGH (ref 4.20–5.80)
RDW: 14 % (ref 11.0–15.0)
Total Lymphocyte: 41.1 %
WBC: 4.1 10*3/uL (ref 3.8–10.8)

## 2019-09-19 LAB — COMPLETE METABOLIC PANEL WITH GFR
AG Ratio: 1.2 (calc) (ref 1.0–2.5)
ALT: 18 U/L (ref 9–46)
AST: 17 U/L (ref 10–40)
Albumin: 4.2 g/dL (ref 3.6–5.1)
Alkaline phosphatase (APISO): 83 U/L (ref 36–130)
BUN: 10 mg/dL (ref 7–25)
CO2: 29 mmol/L (ref 20–32)
Calcium: 9.6 mg/dL (ref 8.6–10.3)
Chloride: 103 mmol/L (ref 98–110)
Creat: 0.81 mg/dL (ref 0.60–1.35)
GFR, Est African American: 144 mL/min/{1.73_m2} (ref >=60)
GFR, Est Non African American: 124 mL/min/{1.73_m2} (ref >=60)
Globulin: 3.4 g/dL (calc) (ref 1.9–3.7)
Glucose, Bld: 82 mg/dL (ref 65–99)
Potassium: 3.8 mmol/L (ref 3.5–5.3)
Sodium: 138 mmol/L (ref 135–146)
Total Bilirubin: 0.8 mg/dL (ref 0.2–1.2)
Total Protein: 7.6 g/dL (ref 6.1–8.1)

## 2019-09-19 LAB — LIPID PANEL
Cholesterol: 255 mg/dL — ABNORMAL HIGH (ref <200)
HDL: 41 mg/dL (ref >=40)
LDL Cholesterol (Calc): 173 mg/dL (calc) — ABNORMAL HIGH
Non-HDL Cholesterol (Calc): 214 mg/dL (calc) — ABNORMAL HIGH (ref <130)
Total CHOL/HDL Ratio: 6.2 (calc) — ABNORMAL HIGH (ref <5.0)
Triglycerides: 249 mg/dL — ABNORMAL HIGH (ref <150)

## 2019-09-19 LAB — HIV-1 RNA QUANT-NO REFLEX-BLD
HIV 1 RNA Quant: 33800 copies/mL — ABNORMAL HIGH
HIV-1 RNA Quant, Log: 4.53 Log copies/mL — ABNORMAL HIGH

## 2019-09-19 LAB — FLUORESCENT TREPONEMAL AB(FTA)-IGG-BLD: Fluorescent Treponemal ABS: REACTIVE — AB

## 2019-09-19 LAB — RPR: RPR Ser Ql: REACTIVE — AB

## 2019-09-19 LAB — RPR TITER: RPR Titer: 1:16 {titer} — ABNORMAL HIGH

## 2019-09-22 ENCOUNTER — Telehealth: Payer: Self-pay

## 2019-09-22 NOTE — Telephone Encounter (Signed)
Patient called office with friend's phone for lab results. Patient came into office on 3/8 to have blood work done. Will forward message to MD to advise on labs. Patient was instructed to call office back tomorrow. Lorenso Courier, [place]

## 2019-09-22 NOTE — Telephone Encounter (Signed)
CD4 is good 441, HIV RNA 33,000.  His other labs are good.  Is he missing meds?

## 2019-09-27 ENCOUNTER — Other Ambulatory Visit: Payer: Self-pay

## 2019-09-27 ENCOUNTER — Ambulatory Visit (HOSPITAL_COMMUNITY)
Admission: EM | Admit: 2019-09-27 | Discharge: 2019-09-27 | Disposition: A | Discharge status: Home / Self Care | Payer: Medicaid Other | Attending: Family Medicine | Admitting: Family Medicine

## 2019-09-27 ENCOUNTER — Encounter (HOSPITAL_COMMUNITY): Payer: Self-pay

## 2019-09-27 DIAGNOSIS — T63301A Toxic effect of unspecified spider venom, accidental (unintentional), initial encounter: Secondary | ICD-10-CM

## 2019-09-27 DIAGNOSIS — L03312 Cellulitis of back [any part except buttock]: Secondary | ICD-10-CM

## 2019-09-27 MED ORDER — TRIAMCINOLONE ACETONIDE 0.1 % EX CREA: 1.0000 "application " | TOPICAL_CREAM | Freq: Two times a day (BID) | CUTANEOUS | 0 refills | Status: DC

## 2019-09-27 MED ORDER — DOXYCYCLINE HYCLATE 100 MG PO CAPS: 100.0000 mg | ORAL_CAPSULE | Freq: Two times a day (BID) | ORAL | 0 refills | Status: AC

## 2019-09-27 NOTE — ED Triage Notes (Signed)
Pt c/o acute onset pain to "rash" area on right back yesterday. Describes as pain shooting down back. Pt states he thinks he felt a "bite" occur while at work. Approx 2.5 cm length area of edema/erythema noted with several vesicular centers.

## 2019-09-27 NOTE — Discharge Instructions (Signed)
Please begin doxycycline twice daily to treat for infection May apply triamcinolone topically twice daily to help with local inflammation and swelling Warm compresses Use anti-inflammatories for pain/swelling. You may take up to 800 mg Ibuprofen every 8 hours with food. You may supplement Ibuprofen with Tylenol [phone] mg every 8 hours.   Please continue to monitor for gradual improvement, if symptoms progressing or worsening to follow-up for recheck

## 2019-09-27 NOTE — ED Provider Notes (Signed)
[facility]    CSN: [id number] Arrival date & time: 09/27/19  1316      History   Chief Complaint Chief Complaint  Patient presents with  . Rash    HPI Seth Taylor is a 24 y.o. male history of HIV, presenting today for evaluation of a spider bite.  Patient notes that yesterday he felt a bite and stinging sensation while at work.  Since he has developed increased pain swelling and redness to this area.  Pain has been shooting in his back.  He denies any drainage.  Denies fevers.  Denies dizziness or lightheadedness.  Denies vomiting, abdominal pain.  Does report some mild nausea.  HPI  Past Medical History:  Diagnosis Date  . HIV disease ([place])   . Mononucleosis   . Syphilis     Patient Active Problem List   Diagnosis Date Noted  . Morbid obesity ([place]) 03/31/2018  . HIV disease ([place]) 07/08/2014  . Syphilis 07/08/2014  . Gonorrhea 07/08/2014  . Hypertriglyceridemia 07/08/2014    History reviewed. No pertinent surgical history.     Home Medications    Prior to Admission medications   Medication Sig Start Date End Date Taking? Authorizing Provider  GENVOYA 150-150-200-10 MG TABS tablet TAKE 1 TABLET BY MOUTH DAILY WITH BREAKFAST 08/19/18  Yes Campbell Riches, MD  cyclobenzaprine (FLEXERIL) 10 MG tablet Take 1 tablet (10 mg total) by mouth 2 (two) times daily as needed for muscle spasms. 08/16/19   Faustino Congress, NP  doxycycline (VIBRAMYCIN) 100 MG capsule Take 1 capsule (100 mg total) by mouth 2 (two) times daily for 7 days. 09/27/19 10/04/19  Trula Frede C, PA-C  ibuprofen (ADVIL) 600 MG tablet Take 1 tablet (600 mg total) by mouth every 6 (six) hours as needed. 08/16/19   Faustino Congress, NP  triamcinolone cream (KENALOG) 0.1 % Apply 1 application topically 2 (two) times daily. 09/27/19   Diego Delancey C, PA-C  famotidine (PEPCID) 20 MG tablet Take 1 tablet (20 mg total) by mouth 2 (two) times daily. Patient not taking: Reported on 07/18/2018  07/11/18 09/27/19  Franchot Heidelberg, PA-C    Family History Family History  Problem Relation Age of Onset  . CVA Mother        seconday to drug use  . Diabetes Father   . Hepatitis C Father     Social History Social History   Tobacco Use  . Smoking status: Former Research scientist (life sciences)  . Smokeless tobacco: Never Used  Substance Use Topics  . Alcohol use: Yes    Alcohol/week: 0.0 standard drinks    Comment: "on occassion"  . Drug use: Yes    Types: Marijuana     Allergies   Patient has no known allergies.   Review of Systems Review of Systems  Constitutional: Negative for fatigue and fever.  Eyes: Negative for redness, itching and visual disturbance.  Respiratory: Negative for shortness of breath.   Cardiovascular: Negative for chest pain and leg swelling.  Gastrointestinal: Negative for nausea and vomiting.  Musculoskeletal: Positive for back pain. Negative for arthralgias and myalgias.  Skin: Positive for color change and rash. Negative for wound.  Neurological: Negative for dizziness, syncope, weakness, light-headedness and headaches.     Physical Exam Triage Vital Signs ED Triage Vitals  Enc Vitals Group     BP 09/27/19 1348 115/70     Pulse Rate 09/27/19 1348 78     Resp 09/27/19 1348 18     Temp 09/27/19 1348 98.6 F (  37 C)     Temp Source 09/27/19 1348 Oral     SpO2 09/27/19 1348 96 %     Weight --      Height --      Head Circumference --      Peak Flow --      Pain Score 09/27/19 1347 7     Pain Loc --      Pain Edu? --      Excl. in GC? --    No data found.  Updated Vital Signs BP 115/70 (BP Location: Right Arm)   Pulse 78   Temp 98.6 F (37 C) (Oral)   Resp 18   SpO2 96%   Visual Acuity Right Eye Distance:   Left Eye Distance:   Bilateral Distance:    Right Eye Near:   Left Eye Near:    Bilateral Near:     Physical Exam Vitals and nursing note reviewed.  Constitutional:      Appearance: He is well-developed.     Comments: No acute  distress  HENT:     Head: Normocephalic and atraumatic.     Nose: Nose normal.  Eyes:     Conjunctiva/sclera: Conjunctivae normal.  Cardiovascular:     Rate and Rhythm: Normal rate.  Pulmonary:     Effort: Pulmonary effort is normal. No respiratory distress.  Abdominal:     General: There is no distension.  Musculoskeletal:        General: Normal range of motion.     Cervical back: Neck supple.  Skin:    General: Skin is warm and dry.     Comments: 4 cm x 3 cm area slightly raised with faint erythema, central 1 cm x 1 cm area with increased erythema, induration noted, no fluctuance  Neurological:     Mental Status: He is alert and oriented to person, place, and time.        UC Treatments / Results  Labs (all labs ordered are listed, but only abnormal results are displayed) Labs Reviewed - No data to display  EKG   Radiology No results found.  Procedures Procedures (including critical care time)  Medications Ordered in UC Medications - No data to display  Initial Impression / Assessment and Plan / UC Course  I have reviewed the triage vital signs and the nursing notes.  Pertinent labs & imaging results that were available during my care of the patient were reviewed by me and considered in my medical decision making (see chart for details).     Exam consistent with likely bite, concerning for infection/abscess development.  She had been on doxycycline.  May use triamcinolone topically to help with local inflammation.  Warm compresses.  Tylenol and ibuprofen.  Close monitoring, if developing more abscess may need I&D.  No systemic symptoms.  Discussed strict return precautions. Patient verbalized understanding and is agreeable with plan.  Final Clinical Impressions(s) / UC Diagnoses   Final diagnoses:  Spider bite wound, accidental or unintentional, initial encounter  Cellulitis of back except buttock     Discharge Instructions     Please begin doxycycline  twice daily to treat for infection May apply triamcinolone topically twice daily to help with local inflammation and swelling Warm compresses Use anti-inflammatories for pain/swelling. You may take up to 800 mg Ibuprofen every 8 hours with food. You may supplement Ibuprofen with Tylenol [phone] mg every 8 hours.   Please continue to monitor for gradual improvement, if symptoms progressing or worsening  to follow-up for recheck   ED Prescriptions    Medication Sig Dispense Auth. Provider   doxycycline (VIBRAMYCIN) 100 MG capsule Take 1 capsule (100 mg total) by mouth 2 (two) times daily for 7 days. 14 capsule Stepheni Cameron C, PA-C   triamcinolone cream (KENALOG) 0.1 % Apply 1 application topically 2 (two) times daily. 45 g Detravion Tester, [place] C, PA-C     PDMP not reviewed this encounter.   Lew Dawes, [place] 09/27/19 1439

## 2019-10-01 ENCOUNTER — Ambulatory Visit: Payer: Medicaid Other | Admitting: Infectious Diseases

## 2019-10-02 ENCOUNTER — Encounter: Payer: Self-pay | Admitting: Infectious Diseases

## 2019-10-06 ENCOUNTER — Other Ambulatory Visit: Payer: Self-pay

## 2019-10-06 ENCOUNTER — Ambulatory Visit (INDEPENDENT_AMBULATORY_CARE_PROVIDER_SITE_OTHER): Payer: Self-pay | Admitting: Infectious Diseases

## 2019-10-06 DIAGNOSIS — A539 Syphilis, unspecified: Secondary | ICD-10-CM

## 2019-10-06 DIAGNOSIS — Z113 Encounter for screening for infections with a predominantly sexual mode of transmission: Secondary | ICD-10-CM

## 2019-10-06 DIAGNOSIS — B20 Human immunodeficiency virus [HIV] disease: Secondary | ICD-10-CM

## 2019-10-06 DIAGNOSIS — Z79899 Other long term (current) drug therapy: Secondary | ICD-10-CM

## 2019-10-06 DIAGNOSIS — E781 Pure hyperglyceridemia: Secondary | ICD-10-CM

## 2019-10-06 NOTE — Progress Notes (Signed)
   Subjective:    Patient ID: Seth Taylor, male    DOB: Nov 05, 1995, 24 y.o.   MRN: [id number]  HPI 24yo M with HIV+ (and syphilis) dx with STI screening 06-2014. He was started on genvoya.  He has been retreated for syphilis (03-2018). Afterwards had a skin rash that did not improve, he was to be seen by derm. He did not see them and his rash resolved.  He converted his visit to phone due being busy. He was seen in UC recently for "spider bite" and was given anbx- doxy. Has been forgetting his rx occasionally.   HIV 1 RNA Quant (copies/mL)  Date Value  09/17/2019 33,800 (H)  07/15/2018 18,900 (H)  03/31/2018 352 (H)   CD4 T Cell Abs (/uL)  Date Value  09/17/2019 441  07/15/2018 530  03/17/2018 1,150     Review of Systems  Constitutional: Negative for appetite change and unexpected weight change.  Gastrointestinal: Negative for constipation and diarrhea.  Genitourinary: Negative for difficulty urinating.  Psychiatric/Behavioral: Positive for sleep disturbance.  Please see HPI. All other systems reviewed and negative.      Objective:   Physical Exam Phone note- no exam     Assessment & Plan:

## 2019-10-06 NOTE — Assessment & Plan Note (Addendum)
His repeat RPR is down by 1/2.  Pt encouraged.  Repeat at f/u visit

## 2019-10-06 NOTE — Assessment & Plan Note (Addendum)
Will need to repeat his labs encouraged adherence.  Needs condoms- he can get when he comes for lab work.  Repeat visit 3 months.  Time on phone with pt 10 minutes

## 2019-10-14 ENCOUNTER — Telehealth: Payer: Self-pay

## 2019-10-14 NOTE — Telephone Encounter (Signed)
Patient called office today requesting work note from last office visit. States he works two jobs and both are requesting note. Will have note left with front desk. Seth Taylor, [place]

## 2019-12-15 ENCOUNTER — Other Ambulatory Visit: Payer: Medicaid Other

## 2019-12-29 ENCOUNTER — Ambulatory Visit: Payer: Medicaid Other | Admitting: Infectious Diseases

## 2020-03-14 ENCOUNTER — Telehealth: Payer: Self-pay | Admitting: *Deleted

## 2020-03-14 NOTE — Telephone Encounter (Signed)
Patient called for advice, appointment. He thinks he has been exposed to Gonorrhea. RN advised patient to contact the health department for treatment. RN will call patient to schedule labs and follow up. Andree Coss, RN

## 2020-03-18 NOTE — Telephone Encounter (Signed)
RN called patient to schedule labs, follow up.  Call went to voicemail.  Left message asking patient to call back to schedule. Andree Coss, RN

## 2020-04-01 NOTE — Telephone Encounter (Signed)
Have attempted to contact patient to schedule an appointment, but call was cut short by emergency in clinic on 2 occassions. RN reached out to patient today 10/22 to offer to schedule appointment, but call went to voicemail that is not yet set up. Patient has not contacted the front desk to set up an appointment. Andree Coss, RN

## 2020-05-10 ENCOUNTER — Other Ambulatory Visit: Payer: Self-pay

## 2020-05-10 ENCOUNTER — Other Ambulatory Visit: Payer: Self-pay | Admitting: Infectious Diseases

## 2020-05-10 ENCOUNTER — Ambulatory Visit (INDEPENDENT_AMBULATORY_CARE_PROVIDER_SITE_OTHER): Payer: Self-pay | Admitting: Infectious Diseases

## 2020-05-10 ENCOUNTER — Encounter: Payer: Self-pay | Admitting: Infectious Diseases

## 2020-05-10 VITALS — BP 151/82 | HR 94 | Temp 98.5°F | Ht 71.0 in | Wt 290.0 lb

## 2020-05-10 DIAGNOSIS — Z79899 Other long term (current) drug therapy: Secondary | ICD-10-CM

## 2020-05-10 DIAGNOSIS — K629 Disease of anus and rectum, unspecified: Secondary | ICD-10-CM | POA: Insufficient documentation

## 2020-05-10 DIAGNOSIS — B20 Human immunodeficiency virus [HIV] disease: Secondary | ICD-10-CM

## 2020-05-10 DIAGNOSIS — I10 Essential (primary) hypertension: Secondary | ICD-10-CM | POA: Insufficient documentation

## 2020-05-10 DIAGNOSIS — Z113 Encounter for screening for infections with a predominantly sexual mode of transmission: Secondary | ICD-10-CM

## 2020-05-10 NOTE — Progress Notes (Signed)
   Subjective:    Patient ID: Seth Taylor, male    DOB: 1995-12-24, 24 y.o.   MRN: [id number]  HPI 24yo M with HIV+ (and syphilis) dx with STI screening 06-2014. He was started on genvoya.  He has been retreated for syphilis (03-2018).   Does not want flu-vax today.  States adherence to his meds.  Has been doing well. Has lesion on his anus after anal sex. Feels that he has something coming out when he goes to the bathroom.   HIV 1 RNA Quant (copies/mL)  Date Value  09/17/2019 33,800 (H)  07/15/2018 18,900 (H)  03/31/2018 352 (H)   CD4 T Cell Abs (/uL)  Date Value  09/17/2019 441  07/15/2018 530  03/17/2018 1,150    Review of Systems  Constitutional: Negative for appetite change, chills, fever and unexpected weight change.  Gastrointestinal: Negative for blood in stool, constipation, diarrhea and rectal pain.  Genitourinary: Negative for difficulty urinating.  Skin: Positive for rash (improving. ).  Neurological: Negative for dizziness and headaches.       Objective:   Physical Exam Vitals reviewed.  Constitutional:      Appearance: Normal appearance.  HENT:     Mouth/Throat:     Mouth: Mucous membranes are moist.     Pharynx: No oropharyngeal exudate or posterior oropharyngeal erythema.  Eyes:     Extraocular Movements: Extraocular movements intact.     Pupils: Pupils are equal, round, and reactive to light.  Cardiovascular:     Rate and Rhythm: Normal rate and regular rhythm.  Pulmonary:     Effort: Pulmonary effort is normal.     Breath sounds: Normal breath sounds.  Abdominal:     General: Bowel sounds are normal. There is no distension.     Palpations: Abdomen is soft.     Tenderness: There is no abdominal tenderness.     Comments: No rectal lesions seen. Chaperone present. Internal exam not tolerated.   Musculoskeletal:        General: Normal range of motion.     Cervical back: Normal range of motion and neck supple.     Right lower leg: No edema.       Left lower leg: No edema.  Neurological:     General: No focal deficit present.     Mental Status: He is alert.  Psychiatric:        Mood and Affect: Mood normal.           Assessment & Plan:

## 2020-05-10 NOTE — Assessment & Plan Note (Signed)
He attributes to diet.  Will f/u in 6 months.

## 2020-05-10 NOTE — Assessment & Plan Note (Signed)
Unable to see a lesion. Will send him to GI for endo Prolapse?

## 2020-05-10 NOTE — Assessment & Plan Note (Addendum)
He appears to be doing well, states adherence.  Will recheck his labs today to see if this is true.  Will continue genvoya for now.  Encouraged him to get flu (refuses) Refuses COVID vax- agrees to questionaire when available Given lube Needs Hep B series.  rtc in 6 months.

## 2020-05-11 ENCOUNTER — Encounter: Payer: Self-pay | Admitting: Infectious Diseases

## 2020-05-11 LAB — T-HELPER CELL (CD4) - (RCID CLINIC ONLY)
CD4 % Helper T Cell: 24 % — ABNORMAL LOW (ref 33–65)
CD4 T Cell Abs: 487 /uL (ref 400–1790)

## 2020-05-19 LAB — HIV-1 RNA ULTRAQUANT REFLEX TO GENTYP+
HIV 1 RNA Quant: 115000 copies/mL — ABNORMAL HIGH
HIV-1 RNA Quant, Log: 5.06 Log copies/mL — ABNORMAL HIGH

## 2020-05-19 LAB — COMPREHENSIVE METABOLIC PANEL
AG Ratio: 1.2 (calc) (ref 1.0–2.5)
ALT: 19 U/L (ref 9–46)
AST: 17 U/L (ref 10–40)
Albumin: 4.3 g/dL (ref 3.6–5.1)
Alkaline phosphatase (APISO): 104 U/L (ref 36–130)
BUN: 12 mg/dL (ref 7–25)
CO2: 29 mmol/L (ref 20–32)
Calcium: 9.5 mg/dL (ref 8.6–10.3)
Chloride: 100 mmol/L (ref 98–110)
Creat: 0.97 mg/dL (ref 0.60–1.35)
Globulin: 3.5 g/dL (calc) (ref 1.9–3.7)
Glucose, Bld: 97 mg/dL (ref 65–99)
Potassium: 3.6 mmol/L (ref 3.5–5.3)
Sodium: 138 mmol/L (ref 135–146)
Total Bilirubin: 0.4 mg/dL (ref 0.2–1.2)
Total Protein: 7.8 g/dL (ref 6.1–8.1)

## 2020-05-19 LAB — FLUORESCENT TREPONEMAL AB(FTA)-IGG-BLD: Fluorescent Treponemal ABS: REACTIVE — AB

## 2020-05-19 LAB — CBC
HCT: 44.3 % (ref 38.5–50.0)
Hemoglobin: 15.6 g/dL (ref 13.2–17.1)
MCH: 28 pg (ref 27.0–33.0)
MCHC: 35.2 g/dL (ref 32.0–36.0)
MCV: 79.4 fL — ABNORMAL LOW (ref 80.0–100.0)
MPV: 10.7 fL (ref 7.5–12.5)
Platelets: 242 10*3/uL (ref 140–400)
RBC: 5.58 10*6/uL (ref 4.20–5.80)
RDW: 13.7 % (ref 11.0–15.0)
WBC: 6.8 10*3/uL (ref 3.8–10.8)

## 2020-05-19 LAB — LIPID PANEL
Cholesterol: 264 mg/dL — ABNORMAL HIGH (ref <200)
HDL: 39 mg/dL — ABNORMAL LOW (ref >=40)
LDL Cholesterol (Calc): 158 mg/dL (calc) — ABNORMAL HIGH
Non-HDL Cholesterol (Calc): 225 mg/dL (calc) — ABNORMAL HIGH (ref <130)
Total CHOL/HDL Ratio: 6.8 (calc) — ABNORMAL HIGH (ref <5.0)
Triglycerides: 396 mg/dL — ABNORMAL HIGH (ref <150)

## 2020-05-19 LAB — RPR: RPR Ser Ql: REACTIVE — AB

## 2020-05-19 LAB — HIV-1 GENOTYPE: HIV-1 Genotype: DETECTED — AB

## 2020-05-19 LAB — RPR TITER: RPR Titer: 1:16 {titer} — ABNORMAL HIGH

## 2020-05-25 ENCOUNTER — Other Ambulatory Visit: Payer: Self-pay

## 2020-05-25 ENCOUNTER — Ambulatory Visit (INDEPENDENT_AMBULATORY_CARE_PROVIDER_SITE_OTHER): Payer: Medicaid Other

## 2020-05-25 ENCOUNTER — Telehealth: Payer: Self-pay

## 2020-05-25 DIAGNOSIS — A549 Gonococcal infection, unspecified: Secondary | ICD-10-CM | POA: Diagnosis not present

## 2020-05-25 DIAGNOSIS — B20 Human immunodeficiency virus [HIV] disease: Secondary | ICD-10-CM

## 2020-05-25 DIAGNOSIS — A749 Chlamydial infection, unspecified: Secondary | ICD-10-CM

## 2020-05-25 DIAGNOSIS — A539 Syphilis, unspecified: Secondary | ICD-10-CM | POA: Diagnosis not present

## 2020-05-25 DIAGNOSIS — R369 Urethral discharge, unspecified: Secondary | ICD-10-CM

## 2020-05-25 MED ORDER — PENICILLIN G BENZATHINE 1200000 UNIT/2ML IM SUSP
1.2000 10*6.[IU] | Freq: Once | INTRAMUSCULAR | Status: AC
Start: 2020-05-25 — End: 2020-05-25
  Administered 2020-05-25: 16:00:00 1.2 10*6.[IU] via INTRAMUSCULAR

## 2020-05-25 MED ORDER — PENICILLIN G BENZATHINE 1200000 UNIT/2ML IM SUSP
1.2000 10*6.[IU] | Freq: Once | INTRAMUSCULAR | Status: AC
  Administered 2020-05-25: 15:00:00 1.2 10*6.[IU] via INTRAMUSCULAR

## 2020-05-25 MED ORDER — CEFTRIAXONE SODIUM 500 MG IJ SOLR
500.0000 mg | Freq: Once | INTRAMUSCULAR | Status: AC
  Administered 2020-05-25: 15:00:00 500 mg via INTRAMUSCULAR

## 2020-05-25 MED ORDER — GENVOYA 150-150-200-10 MG PO TABS: 1.0000 | ORAL_TABLET | Freq: Every day | ORAL | 1 refills | Status: DC

## 2020-05-25 NOTE — Telephone Encounter (Signed)
Results reviewed by Rexene Alberts, NP. Due to patient having symptoms of STI verbal order given to treated for syphilis with Bicillin 2.4MU IM once. Valarie Cones

## 2020-05-25 NOTE — Progress Notes (Addendum)
Patient walked into office for STI treatment due to exposure and having current symptoms such as white drainage from tip of penis. Verbal order received from Bobetta Lime, NP for bicillin million units IM once for syphilis treatment. Also received verbal order for ceftriaxone 500 mg IM once to rule out chlamydia  Swabs also collected. Patient was unable to provide urine.   Patient also advise to abstain from any sexal contact for 10  Day after treatment. Patient accepts condoms.   Patient also made aware of viral load level and encouraged to take Genvoya daily as prescribed. Patient verbalized understanding.   Rx sent electronically to pharmacy.  Patient tolerated injections well.

## 2020-05-25 NOTE — Telephone Encounter (Signed)
Patient is calling regarding STI results. Please review and advise .  Laurell Josephs, RN

## 2020-05-25 NOTE — Telephone Encounter (Signed)
No problem - and actually I don't see any urine cytology recently. If he has burning/urethral discharge I would also treat with 500 mg IM ceftriaxone and collect urine swab + oral and rectal swabs to rule out chlamydia. I suspect more gonorrhea than syphilis causing his current symptoms.  Thank you!

## 2020-05-26 ENCOUNTER — Telehealth: Payer: Self-pay

## 2020-05-26 DIAGNOSIS — A749 Chlamydial infection, unspecified: Secondary | ICD-10-CM

## 2020-05-26 LAB — CYTOLOGY, (ORAL, ANAL, URETHRAL) ANCILLARY ONLY
Chlamydia: NEGATIVE
Chlamydia: POSITIVE — AB
Comment: NEGATIVE
Comment: NEGATIVE
Comment: NORMAL
Comment: NORMAL
Neisseria Gonorrhea: NEGATIVE
Neisseria Gonorrhea: POSITIVE — AB

## 2020-05-26 MED ORDER — DOXYCYCLINE HYCLATE 100 MG PO TABS
100.0000 mg | ORAL_TABLET | Freq: Two times a day (BID) | ORAL | 0 refills | Status: AC
Start: 2020-05-26 — End: 2020-06-02

## 2020-05-26 NOTE — Telephone Encounter (Signed)
Made patient aware of results. Prefers doxycycline to be sent to [facility].

## 2020-05-26 NOTE — Telephone Encounter (Signed)
Thanks so much. 

## 2020-05-26 NOTE — Telephone Encounter (Signed)
-----   Message from Blanchard Kelch, NP sent at 05/26/2020  3:28 PM EST ----- Can you call Seth Taylor and let him know his urethral swab is positive for gonorrhea and chlamydia. Glad we gave him the ceftriaxone yesterday.   He needs 7 days of doxycycline PO 100 mg BID sent in also. Would you mind calling to let him know and send in to preferred pharmacy?  Thank you!

## 2020-07-26 ENCOUNTER — Ambulatory Visit: Payer: Medicaid Other | Admitting: Gastroenterology

## 2020-08-25 ENCOUNTER — Ambulatory Visit: Payer: Self-pay

## 2020-11-02 DIAGNOSIS — Z03818 Encounter for observation for suspected exposure to other biological agents ruled out: Secondary | ICD-10-CM | POA: Diagnosis not present

## 2020-11-08 ENCOUNTER — Telehealth: Payer: Self-pay | Admitting: *Deleted

## 2020-11-08 ENCOUNTER — Ambulatory Visit: Payer: Self-pay | Admitting: Infectious Diseases

## 2020-11-08 NOTE — Telephone Encounter (Signed)
Called patient to see if he was on his way to today's visit; would offer virtual visit if he was available. Call went to voicemail, but it is not yet set up. RN unable to leave message asking patient to reschedule today's missed visit. He is on the viral load list. Andree Coss, RN

## 2020-11-09 DIAGNOSIS — Z20822 Contact with and (suspected) exposure to covid-19: Secondary | ICD-10-CM | POA: Diagnosis not present

## 2020-11-14 DIAGNOSIS — Z20822 Contact with and (suspected) exposure to covid-19: Secondary | ICD-10-CM | POA: Diagnosis not present

## 2020-11-28 DIAGNOSIS — Z20822 Contact with and (suspected) exposure to covid-19: Secondary | ICD-10-CM | POA: Diagnosis not present

## 2020-12-01 DIAGNOSIS — Z20822 Contact with and (suspected) exposure to covid-19: Secondary | ICD-10-CM | POA: Diagnosis not present

## 2020-12-08 DIAGNOSIS — Z20822 Contact with and (suspected) exposure to covid-19: Secondary | ICD-10-CM | POA: Diagnosis not present

## 2020-12-16 ENCOUNTER — Telehealth: Payer: Self-pay

## 2020-12-16 ENCOUNTER — Other Ambulatory Visit: Payer: Self-pay

## 2020-12-16 ENCOUNTER — Ambulatory Visit: Payer: Self-pay

## 2020-12-16 DIAGNOSIS — Z113 Encounter for screening for infections with a predominantly sexual mode of transmission: Secondary | ICD-10-CM

## 2020-12-16 DIAGNOSIS — B20 Human immunodeficiency virus [HIV] disease: Secondary | ICD-10-CM

## 2020-12-16 DIAGNOSIS — Z79899 Other long term (current) drug therapy: Secondary | ICD-10-CM

## 2020-12-16 DIAGNOSIS — Z20822 Contact with and (suspected) exposure to covid-19: Secondary | ICD-10-CM | POA: Diagnosis not present

## 2020-12-16 MED ORDER — GENVOYA 150-150-200-10 MG PO TABS: 1.0000 | ORAL_TABLET | Freq: Every day | ORAL | 0 refills | Status: DC

## 2020-12-16 NOTE — Telephone Encounter (Signed)
Patient walked into clinic today requesting refills for Genvoya. Last refill was sent on 05/25/20 with one additional refill. Has had multiple no shows. Patient will meet with Financial counselor and do labs today. Spoke with Dr. Daiva Eves who is okay with filling patient's medication. Will send 30 day supply to [facility]. Patient understands he must keep appt on 7/11 for future refills. Patient is also requesting office disclose information to Social Security if they call. Informed patient that we are not able to disclose any information until signed release of information has been received. Will provide number for medical records for patient.  Juanita Laster, RMA

## 2020-12-19 ENCOUNTER — Encounter: Payer: Self-pay | Admitting: Infectious Disease

## 2020-12-19 ENCOUNTER — Ambulatory Visit: Payer: Self-pay | Admitting: Infectious Disease

## 2020-12-19 ENCOUNTER — Telehealth: Payer: Self-pay

## 2020-12-19 ENCOUNTER — Ambulatory Visit (INDEPENDENT_AMBULATORY_CARE_PROVIDER_SITE_OTHER): Payer: Self-pay | Admitting: Infectious Disease

## 2020-12-19 ENCOUNTER — Other Ambulatory Visit: Payer: Self-pay

## 2020-12-19 VITALS — BP 119/81 | HR 93 | Temp 98.2°F | Wt 305.0 lb

## 2020-12-19 DIAGNOSIS — G8929 Other chronic pain: Secondary | ICD-10-CM

## 2020-12-19 DIAGNOSIS — F1290 Cannabis use, unspecified, uncomplicated: Secondary | ICD-10-CM

## 2020-12-19 DIAGNOSIS — A549 Gonococcal infection, unspecified: Secondary | ICD-10-CM

## 2020-12-19 DIAGNOSIS — B20 Human immunodeficiency virus [HIV] disease: Secondary | ICD-10-CM

## 2020-12-19 DIAGNOSIS — M545 Low back pain, unspecified: Secondary | ICD-10-CM

## 2020-12-19 DIAGNOSIS — A539 Syphilis, unspecified: Secondary | ICD-10-CM

## 2020-12-19 HISTORY — DX: Low back pain, unspecified: M54.50

## 2020-12-19 HISTORY — DX: Cannabis use, unspecified, uncomplicated: F12.90

## 2020-12-19 MED ORDER — SYMTUZA 800-150-200-10 MG PO TABS: 1.0000 | ORAL_TABLET | Freq: Every day | ORAL | 5 refills | Status: DC

## 2020-12-19 NOTE — Progress Notes (Signed)
Subjective:  Chief complaint follow-up for HIV not consistently taking medications  Patient ID: Seth Taylor, male    DOB: 17-Oct-1995, 25 y.o.   MRN: [id number]  HPI  25 year old black man with history of HIV disease initially diagnosed in 2016 at which point time he was started on Genvoya.  He also had syphilis at that time.  He initially maintained nice virological control with an undetectable viral load in March 14 2016th November 30 2016th Nov 03, 2015.  He then became viremic in October 2019 with a viral load 24,800.    Load came down to 352 in March 31, 2018.  He became viremic again by February 2020 and has been viremic since then.  Talking with him extensively today he told me that he had stopped taking his antiretrovirals in part because he had seen a television commercial stating that antivirals were bad for people.  I suspect this with the notorious Truvada commercials that have caused so much damage to many of our patients.  Not think that he should be on Genvoya given his poor recent adherence.  I would like to switch him over to [facility] and also potentially offer him the opportunity to enroll in the LATTITUDE study if Dr. Ninetta Taylor is agreeable to this. The patient himself seems interested.    Past Medical History:  Diagnosis Date   HIV disease (HCC)    Mononucleosis    Syphilis     No past surgical history on file.  Family History  Problem Relation Age of Onset   CVA Mother        seconday to drug use   Diabetes Father    Hepatitis C Father       Social History   Socioeconomic History   Marital status: Single    Spouse name: Not on file   Number of children: Not on file   Years of education: Not on file   Highest education level: Not on file  Occupational History   Not on file  Tobacco Use   Smoking status: Former    Pack years: 0.00   Smokeless tobacco: Never  Substance and Sexual Activity   Alcohol use: Not Currently    Comment: "on occassion"    Drug use: Yes    Types: Marijuana   Sexual activity: Yes    Partners: Male    Birth control/protection: Condom    Comment: declined condoms  Other Topics Concern   Not on file  Social History Narrative   Not on file   Social Determinants of Health   Financial Resource Strain: Not on file  Food Insecurity: Not on file  Transportation Needs: Not on file  Physical Activity: Not on file  Stress: Not on file  Social Connections: Not on file    No Known Allergies   Current Outpatient Medications:    cyclobenzaprine (FLEXERIL) 10 MG tablet, Take 1 tablet (10 mg total) by mouth 2 (two) times daily as needed for muscle spasms. (Patient not taking: Reported on 12/19/2020), Disp: 20 tablet, Rfl: 0   elvitegravir-cobicistat-emtricitabine-tenofovir (GENVOYA) 150-150-200-10 MG TABS tablet, Take 1 tablet by mouth daily with breakfast. (Patient not taking: Reported on 12/19/2020), Disp: 30 tablet, Rfl: 0   ibuprofen (ADVIL) 600 MG tablet, Take 1 tablet (600 mg total) by mouth every 6 (six) hours as needed. (Patient not taking: Reported on 12/19/2020), Disp: 30 tablet, Rfl: 0   triamcinolone cream (KENALOG) 0.1 %, Apply 1 application topically 2 (two) times daily. (Patient not taking:  Reported on 12/19/2020), Disp: 45 g, Rfl: 0   Review of Systems  Constitutional:  Negative for chills, diaphoresis and fever.  HENT:  Negative for congestion, hearing loss, sore throat and tinnitus.   Respiratory:  Negative for cough, shortness of breath and wheezing.   Cardiovascular:  Negative for chest pain, palpitations and leg swelling.  Gastrointestinal:  Negative for abdominal pain, blood in stool, constipation, diarrhea, nausea and vomiting.  Genitourinary:  Negative for dysuria, flank pain and hematuria.  Musculoskeletal:  Negative for back pain and myalgias.  Skin:  Negative for rash.  Neurological:  Negative for dizziness, weakness and headaches.  Hematological:  Does not bruise/bleed easily.   Psychiatric/Behavioral:  Negative for agitation, confusion, decreased concentration, hallucinations, self-injury, sleep disturbance and suicidal ideas. The patient is not hyperactive.       Objective:   Physical Exam Constitutional:      General: He is not in acute distress.    Appearance: He is not diaphoretic.  HENT:     Head: Normocephalic and atraumatic.     Right Ear: External ear normal.     Left Ear: External ear normal.     Nose: Nose normal.     Mouth/Throat:     Pharynx: No oropharyngeal exudate.  Eyes:     General: No scleral icterus.       Right eye: No discharge.        Left eye: No discharge.     Extraocular Movements: Extraocular movements intact.     Conjunctiva/sclera: Conjunctivae normal.  Cardiovascular:     Rate and Rhythm: Normal rate and regular rhythm.     Heart sounds:    No friction rub.  Pulmonary:     Effort: Pulmonary effort is normal. No respiratory distress.     Breath sounds: No wheezing or rales.  Abdominal:     General: There is no distension.     Palpations: Abdomen is soft.     Tenderness: There is no rebound.  Musculoskeletal:        General: No tenderness. Normal range of motion.     Cervical back: Normal range of motion and neck supple.  Lymphadenopathy:     Cervical: No cervical adenopathy.  Skin:    General: Skin is warm and dry.     Coloration: Skin is not jaundiced or pale.     Findings: No erythema, lesion or rash.  Neurological:     General: No focal deficit present.     Mental Status: He is alert and oriented to person, place, and time.     Coordination: Coordination normal.  Psychiatric:        Mood and Affect: Mood normal.        Behavior: Behavior normal.        Thought Content: Thought content normal.        Judgment: Judgment normal.          Assessment & Plan:  HIV disease has worsened; unfortunately VL is nearly 100k and he clearly has not been taking ARV consistently despite the fact that he clearly could  do so as he shown in the past.  We will recheck for resistance testing today. He does have only an K103N that was undoubtedly transmitted resistance.  I would like to switch him to [facility].  His H MAP is expired so he was provided with a 30-day simple sample bottle.  Will bring him back to clinic in a few weeks we have the data back.  I discussed patient with Misty Stanley. The patient is interested in the study and would get paid in initial part of study for visits adherence and virological suppression.  Will check with his primary ID provider Dr. Ninetta Taylor to make sure that he is okay with this approach.  Marijuana use: he denied THIS being the reason for his non adherence  Obesity: he would like to lose weight.   Low back pain while on Genvoya; I am sure that his regimen had nothing to to with this but it may have all been made problematic due to the harmful Truv[employer] lawsuit sowing confusion and power of suggestion of this advertising.  I spent more than 40  minutes with the patient including face to face counseling of the patient personally reviewing radiographs, along with pertinent laboratory microbiological, virological data review of medical records before and during the visit and in coordination of his care.

## 2020-12-19 NOTE — Telephone Encounter (Signed)
Called patient - left voicemail regarding 11 AM appointment today to see if he would like to do a phone visit or reschedule.    Sharlyn Odonnel Lesli Albee, CMA

## 2020-12-19 NOTE — Telephone Encounter (Signed)
Per Dr. Daiva Eves, patient should not have Genvoya refilled as he is not virologically suppressed and at risk for developing resistance with Genvoya. He would like patient to have an appointment to discuss possibly switching to [facility] which has a higher barrier to resistance. Patient has multiple no shows and needs appointment.   Sandie Ano, RN

## 2020-12-20 ENCOUNTER — Telehealth: Payer: Self-pay

## 2020-12-20 ENCOUNTER — Other Ambulatory Visit (HOSPITAL_COMMUNITY): Payer: Self-pay

## 2020-12-20 LAB — COMPREHENSIVE METABOLIC PANEL
AG Ratio: 1.3 (calc) (ref 1.0–2.5)
ALT: 32 U/L (ref 9–46)
AST: 25 U/L (ref 10–40)
Albumin: 4.2 g/dL (ref 3.6–5.1)
Alkaline phosphatase (APISO): 77 U/L (ref 36–130)
BUN: 9 mg/dL (ref 7–25)
CO2: 28 mmol/L (ref 20–32)
Calcium: 9.1 mg/dL (ref 8.6–10.3)
Chloride: 101 mmol/L (ref 98–110)
Creat: 0.86 mg/dL (ref 0.60–1.35)
Globulin: 3.2 g/dL (calc) (ref 1.9–3.7)
Glucose, Bld: 80 mg/dL (ref 65–99)
Potassium: 4 mmol/L (ref 3.5–5.3)
Sodium: 138 mmol/L (ref 135–146)
Total Bilirubin: 0.5 mg/dL (ref 0.2–1.2)
Total Protein: 7.4 g/dL (ref 6.1–8.1)

## 2020-12-20 LAB — LIPID PANEL
Cholesterol: 379 mg/dL — ABNORMAL HIGH (ref <200)
HDL: 41 mg/dL (ref >=40)
Non-HDL Cholesterol (Calc): 338 mg/dL (calc) — ABNORMAL HIGH (ref <130)
Total CHOL/HDL Ratio: 9.2 (calc) — ABNORMAL HIGH (ref <5.0)
Triglycerides: 532 mg/dL — ABNORMAL HIGH (ref <150)

## 2020-12-20 LAB — T-HELPER CELLS (CD4) COUNT (NOT AT ARMC)
Absolute CD4: 433 cells/uL — ABNORMAL LOW (ref 490–1740)
CD4 T Helper %: 24 % — ABNORMAL LOW (ref 30–61)
Total lymphocyte count: 1804 cells/uL (ref 850–3900)

## 2020-12-20 LAB — HIV-1 RNA QUANT-NO REFLEX-BLD
HIV 1 RNA Quant: 92100 Copies/mL — ABNORMAL HIGH
HIV-1 RNA Quant, Log: 4.96 Log cps/mL — ABNORMAL HIGH

## 2020-12-20 LAB — CBC
HCT: 44.9 % (ref 38.5–50.0)
Hemoglobin: 15.4 g/dL (ref 13.2–17.1)
MCH: 27.7 pg (ref 27.0–33.0)
MCHC: 34.3 g/dL (ref 32.0–36.0)
MCV: 80.9 fL (ref 80.0–100.0)
MPV: 10.6 fL (ref 7.5–12.5)
Platelets: 201 10*3/uL (ref 140–400)
RBC: 5.55 10*6/uL (ref 4.20–5.80)
RDW: 13.7 % (ref 11.0–15.0)
WBC: 4.1 10*3/uL (ref 3.8–10.8)

## 2020-12-20 LAB — RPR TITER: RPR Titer: 1:8 {titer} — ABNORMAL HIGH

## 2020-12-20 LAB — RPR: RPR Ser Ql: REACTIVE — AB

## 2020-12-20 LAB — FLUORESCENT TREPONEMAL AB(FTA)-IGG-BLD: Fluorescent Treponemal ABS: REACTIVE — AB

## 2020-12-20 NOTE — Telephone Encounter (Signed)
Medication Samples have been provided to the patient.  Drug name: Symtuza        Strength: 800/150/200/10 mg Qty: 30  LOT: [id number]   Exp.Date: 03/23  Dosing instructions: Take one tablet by mouth once daily with food  Layloni Fahrner, CPhT [facility] Phone: [phone] Fax:  [phone]  

## 2020-12-22 DIAGNOSIS — Z20822 Contact with and (suspected) exposure to covid-19: Secondary | ICD-10-CM | POA: Diagnosis not present

## 2020-12-25 DIAGNOSIS — Z20822 Contact with and (suspected) exposure to covid-19: Secondary | ICD-10-CM | POA: Diagnosis not present

## 2020-12-30 LAB — HIV RNA, RTPCR W/R GT (RTI, PI,INT)
HIV 1 RNA Quant: 66800 copies/mL — ABNORMAL HIGH
HIV-1 RNA Quant, Log: 4.82 Log copies/mL — ABNORMAL HIGH

## 2020-12-30 LAB — HIV-1 INTEGRASE GENOTYPE

## 2020-12-30 LAB — HIV-1 GENOTYPE: HIV-1 Genotype: DETECTED — AB

## 2021-01-02 DIAGNOSIS — Z20822 Contact with and (suspected) exposure to covid-19: Secondary | ICD-10-CM | POA: Diagnosis not present

## 2021-01-05 DIAGNOSIS — Z20822 Contact with and (suspected) exposure to covid-19: Secondary | ICD-10-CM | POA: Diagnosis not present

## 2021-01-09 DIAGNOSIS — Z20822 Contact with and (suspected) exposure to covid-19: Secondary | ICD-10-CM | POA: Diagnosis not present

## 2021-01-12 ENCOUNTER — Ambulatory Visit (INDEPENDENT_AMBULATORY_CARE_PROVIDER_SITE_OTHER): Payer: Self-pay | Admitting: Infectious Disease

## 2021-01-12 ENCOUNTER — Encounter: Payer: Self-pay | Admitting: Infectious Disease

## 2021-01-12 ENCOUNTER — Other Ambulatory Visit (HOSPITAL_COMMUNITY)
Admission: RE | Admit: 2021-01-12 | Discharge: 2021-01-12 | Disposition: A | Discharge status: Home / Self Care | Payer: Medicaid Other | Source: Ambulatory Visit | Attending: Infectious Disease | Admitting: Infectious Disease

## 2021-01-12 ENCOUNTER — Other Ambulatory Visit: Payer: Self-pay

## 2021-01-12 ENCOUNTER — Ambulatory Visit: Payer: Medicaid Other | Admitting: Infectious Diseases

## 2021-01-12 VITALS — BP 114/75 | HR 76 | Wt 310.0 lb

## 2021-01-12 DIAGNOSIS — Z79899 Other long term (current) drug therapy: Secondary | ICD-10-CM | POA: Diagnosis not present

## 2021-01-12 DIAGNOSIS — B20 Human immunodeficiency virus [HIV] disease: Secondary | ICD-10-CM

## 2021-01-12 DIAGNOSIS — Z5181 Encounter for therapeutic drug level monitoring: Secondary | ICD-10-CM | POA: Diagnosis not present

## 2021-01-12 DIAGNOSIS — A749 Chlamydial infection, unspecified: Secondary | ICD-10-CM

## 2021-01-12 DIAGNOSIS — A549 Gonococcal infection, unspecified: Secondary | ICD-10-CM

## 2021-01-12 DIAGNOSIS — Z717 Human immunodeficiency virus [HIV] counseling: Secondary | ICD-10-CM | POA: Insufficient documentation

## 2021-01-12 DIAGNOSIS — A539 Syphilis, unspecified: Secondary | ICD-10-CM

## 2021-01-12 DIAGNOSIS — Z113 Encounter for screening for infections with a predominantly sexual mode of transmission: Secondary | ICD-10-CM | POA: Diagnosis present

## 2021-01-12 DIAGNOSIS — I10 Essential (primary) hypertension: Secondary | ICD-10-CM

## 2021-01-12 HISTORY — DX: Chlamydial infection, unspecified: A74.9

## 2021-01-12 NOTE — Progress Notes (Signed)
Subjective:  Chief complaint: follow-up for HIV on Symtuza   Patient ID: Seth Taylor, male    DOB: 09-08-1995, 25 y.o.   MRN: [id number]  HPI  25 year old black man with history of HIV disease initially diagnosed in 2016 at which point time he was started on Genvoya.  He also had syphilis at that time.  He initially maintained nice virological control with an undetectable viral load in March 14 2016th November 30 2016th Nov 03, 2015.  He then became viremic in October 2019 with a viral load 24,800.    Load came down to 352 in March 31, 2018.  He became viremic again by February 2020 and has been viremic since then.  Talking with him extensively at my visit with him roughly a month ago he told me that he had stopped taking his antiretrovirals in part because he had seen a television commercial stating that antivirals were bad for people.  I suspect this with the notorious Truv[employer] commercials that have caused so much damage to many of our patients.  I did not think that he should be on Genvoya given his poor recent adherence.  I switched him over to [facility].   I offered him the opportunity to enroll in the LATTITUDE study and at the time he seemed interested in this but this is no longer the case.      Past Medical History:  Diagnosis Date   HIV disease (HCC)    Low back pain 12/19/2020   Marijuana use 12/19/2020   Mononucleosis    Syphilis     No past surgical history on file.  Family History  Problem Relation Age of Onset   CVA Mother        seconday to drug use   Diabetes Father    Hepatitis C Father       Social History   Socioeconomic History   Marital status: Single    Spouse name: Not on file   Number of children: Not on file   Years of education: Not on file   Highest education level: Not on file  Occupational History   Not on file  Tobacco Use   Smoking status: Former   Smokeless tobacco: Never  Substance and Sexual Activity   Alcohol use: Not  Currently    Comment: "on occassion"   Drug use: Yes    Types: Marijuana   Sexual activity: Yes    Partners: Male    Birth control/protection: Condom    Comment: declined condoms  Other Topics Concern   Not on file  Social History Narrative   Not on file   Social Determinants of Health   Financial Resource Strain: Not on file  Food Insecurity: Not on file  Transportation Needs: Not on file  Physical Activity: Not on file  Stress: Not on file  Social Connections: Not on file    No Known Allergies   Current Outpatient Medications:    Darunavir-Cobicisctat-Emtricitabine-Tenofovir Alafenamide (SYMTUZA) 800-150-200-10 MG TABS, Take 1 tablet by mouth daily with breakfast., Disp: 30 tablet, Rfl: 5   cyclobenzaprine (FLEXERIL) 10 MG tablet, Take 1 tablet (10 mg total) by mouth 2 (two) times daily as needed for muscle spasms. (Patient not taking: Reported on 12/19/2020), Disp: 20 tablet, Rfl: 0   ibuprofen (ADVIL) 600 MG tablet, Take 1 tablet (600 mg total) by mouth every 6 (six) hours as needed. (Patient not taking: Reported on 12/19/2020), Disp: 30 tablet, Rfl: 0   triamcinolone cream (KENALOG) 0.1 %,  Apply 1 application topically 2 (two) times daily. (Patient not taking: Reported on 12/19/2020), Disp: 45 g, Rfl: 0   Review of Systems  Constitutional:  Negative for chills and fever.  HENT:  Negative for congestion and sore throat.   Eyes:  Negative for photophobia.  Respiratory:  Negative for cough, shortness of breath and wheezing.   Cardiovascular:  Negative for chest pain, palpitations and leg swelling.  Gastrointestinal:  Negative for abdominal pain, blood in stool, constipation, diarrhea, nausea and vomiting.  Genitourinary:  Negative for dysuria, flank pain and hematuria.  Musculoskeletal:  Negative for back pain and myalgias.  Skin:  Negative for rash.  Neurological:  Negative for dizziness, weakness and headaches.  Hematological:  Does not bruise/bleed easily.   Psychiatric/Behavioral:  Negative for agitation, confusion, decreased concentration, hallucinations, sleep disturbance and suicidal ideas. The patient is not nervous/anxious and is not hyperactive.       Objective:   Physical Exam Constitutional:      General: He is not in acute distress.    Appearance: Normal appearance. He is well-developed. He is not ill-appearing or diaphoretic.  HENT:     Head: Normocephalic and atraumatic.     Right Ear: Hearing and external ear normal.     Left Ear: Hearing and external ear normal.     Nose: No nasal deformity or rhinorrhea.  Eyes:     General: No scleral icterus.    Conjunctiva/sclera: Conjunctivae normal.     Right eye: Right conjunctiva is not injected.     Left eye: Left conjunctiva is not injected.     Pupils: Pupils are equal, round, and reactive to light.  Neck:     Vascular: No JVD.  Cardiovascular:     Rate and Rhythm: Normal rate and regular rhythm.     Heart sounds: S1 normal and S2 normal.  Pulmonary:     Effort: No respiratory distress.     Breath sounds: No wheezing or rales.  Abdominal:     General: Bowel sounds are normal. There is no distension.     Palpations: Abdomen is soft.     Tenderness: There is no abdominal tenderness.  Musculoskeletal:        General: Normal range of motion.     Right shoulder: Normal.     Left shoulder: Normal.     Cervical back: Normal range of motion and neck supple.     Right hip: Normal.     Left hip: Normal.     Right knee: Normal.     Left knee: Normal.  Lymphadenopathy:     Head:     Right side of head: No submandibular, preauricular or posterior auricular adenopathy.     Left side of head: No submandibular, preauricular or posterior auricular adenopathy.     Cervical: No cervical adenopathy.     Right cervical: No superficial or deep cervical adenopathy.    Left cervical: No superficial or deep cervical adenopathy.  Skin:    General: Skin is warm and dry.     Coloration:  Skin is not pale.     Findings: No abrasion, bruising, ecchymosis, erythema, lesion or rash.     Nails: There is no clubbing.  Neurological:     General: No focal deficit present.     Mental Status: He is alert and oriented to person, place, and time.     Sensory: No sensory deficit.     Coordination: Coordination normal.     Gait: Gait normal.  Psychiatric:        Attention and Perception: He is attentive.        Mood and Affect: Mood normal.        Speech: Speech normal.        Behavior: Behavior normal. Behavior is cooperative.        Thought Content: Thought content normal.        Judgment: Judgment normal.          Assessment & Plan:  HIV disease:  Hopefully he is being adherent now  I am continuing to rx his Symtuza. I am checking a VL with reflex to genotype, CD4 CBC w diff, CMP  I have scheduled him again with Dr. Ninetta Lights in September. I think he may benefit from frequent visits  Obesity: continue efforts at weight loss including carbohydrate restriction  Hx of hypertenion: normotensive today  Vitals:   01/12/21 1512  BP: 114/75  Pulse: 76   Syphilis GC hx: RPR reviewed from July 8th and trending down from 1:16 He needs to be rested for [facility] and chlamydia and I will order testing

## 2021-01-13 DIAGNOSIS — Z20822 Contact with and (suspected) exposure to covid-19: Secondary | ICD-10-CM | POA: Diagnosis not present

## 2021-01-13 LAB — T-HELPER CELL (CD4) - (RCID CLINIC ONLY)
CD4 % Helper T Cell: 25 % — ABNORMAL LOW (ref 33–65)
CD4 T Cell Abs: 366 /uL — ABNORMAL LOW (ref 400–1790)

## 2021-01-15 LAB — URINE CYTOLOGY ANCILLARY ONLY
Chlamydia: NEGATIVE
Comment: NEGATIVE
Comment: NORMAL
Neisseria Gonorrhea: NEGATIVE

## 2021-01-16 LAB — COMPLETE METABOLIC PANEL WITH GFR
AG Ratio: 1.3 (calc) (ref 1.0–2.5)
ALT: 20 U/L (ref 9–46)
AST: 16 U/L (ref 10–40)
Albumin: 4.2 g/dL (ref 3.6–5.1)
Alkaline phosphatase (APISO): 72 U/L (ref 36–130)
BUN: 10 mg/dL (ref 7–25)
CO2: 29 mmol/L (ref 20–32)
Calcium: 9.3 mg/dL (ref 8.6–10.3)
Chloride: 101 mmol/L (ref 98–110)
Creat: 0.98 mg/dL (ref 0.60–1.24)
Globulin: 3.3 g/dL (calc) (ref 1.9–3.7)
Glucose, Bld: 74 mg/dL (ref 65–99)
Potassium: 4 mmol/L (ref 3.5–5.3)
Sodium: 138 mmol/L (ref 135–146)
Total Bilirubin: 0.5 mg/dL (ref 0.2–1.2)
Total Protein: 7.5 g/dL (ref 6.1–8.1)
eGFR: 110 mL/min/{1.73_m2} (ref >=60)

## 2021-01-16 LAB — CBC WITH DIFFERENTIAL/PLATELET
Absolute Monocytes: 415 cells/uL (ref 200–950)
Basophils Absolute: 20 cells/uL (ref 0–200)
Basophils Relative: 0.4 %
Eosinophils Absolute: 50 cells/uL (ref 15–500)
Eosinophils Relative: 1 %
HCT: 44.1 % (ref 38.5–50.0)
Hemoglobin: 15.2 g/dL (ref 13.2–17.1)
Lymphs Abs: 1840 cells/uL (ref 850–3900)
MCH: 27.5 pg (ref 27.0–33.0)
MCHC: 34.5 g/dL (ref 32.0–36.0)
MCV: 79.9 fL — ABNORMAL LOW (ref 80.0–100.0)
MPV: 10.7 fL (ref 7.5–12.5)
Monocytes Relative: 8.3 %
Neutro Abs: 2675 cells/uL (ref 1500–7800)
Neutrophils Relative %: 53.5 %
Platelets: 232 10*3/uL (ref 140–400)
RBC: 5.52 10*6/uL (ref 4.20–5.80)
RDW: 13.7 % (ref 11.0–15.0)
Total Lymphocyte: 36.8 %
WBC: 5 10*3/uL (ref 3.8–10.8)

## 2021-01-16 LAB — FLUORESCENT TREPONEMAL AB(FTA)-IGG-BLD: Fluorescent Treponemal ABS: REACTIVE — AB

## 2021-01-16 LAB — HIV-1 RNA ULTRAQUANT REFLEX TO GENTYP+
HIV 1 RNA Quant: 247 copies/mL — ABNORMAL HIGH
HIV-1 RNA Quant, Log: 2.39 Log copies/mL — ABNORMAL HIGH

## 2021-01-16 LAB — RPR: RPR Ser Ql: REACTIVE — AB

## 2021-01-16 LAB — RPR TITER: RPR Titer: 1:16 {titer} — ABNORMAL HIGH

## 2021-01-17 ENCOUNTER — Telehealth: Payer: Self-pay

## 2021-01-17 DIAGNOSIS — Z20822 Contact with and (suspected) exposure to covid-19: Secondary | ICD-10-CM | POA: Diagnosis not present

## 2021-01-17 NOTE — Telephone Encounter (Signed)
-----   Message from Randall Hiss, MD sent at 01/16/2021  7:26 PM EDT ----- Regarding: Seth Taylor deserves HUGE KUDOS for getting his virus uncer control. HIs RPR only went up one diluition so does not require rx, I cheduled him back w you Trey Paula after visit w me (he didnt want to do LATTIUDE study)  ----- Message ----- From: Interface, [facility] Lab Results In Sent: 01/12/2021  10:53 PM EDT To: Randall Hiss, MD

## 2021-01-17 NOTE — Telephone Encounter (Signed)
Called patient to congratulate him on improved viral load. No answer and voicemail box not set up.   Sandie Ano, RN

## 2021-02-16 ENCOUNTER — Ambulatory Visit: Payer: Medicaid Other | Admitting: Infectious Diseases

## 2021-02-23 ENCOUNTER — Ambulatory Visit: Payer: Medicaid Other | Admitting: Infectious Diseases

## 2021-03-01 ENCOUNTER — Encounter: Payer: Self-pay | Admitting: Infectious Diseases

## 2021-03-01 ENCOUNTER — Other Ambulatory Visit: Payer: Self-pay

## 2021-03-01 ENCOUNTER — Ambulatory Visit: Payer: Self-pay | Admitting: Pharmacist

## 2021-03-01 DIAGNOSIS — B20 Human immunodeficiency virus [HIV] disease: Secondary | ICD-10-CM

## 2021-03-01 NOTE — Progress Notes (Addendum)
HPI: Seth Taylor is a 25 y.o. male who presents to the [facility] pharmacy clinic for HIV follow-up.  Patient Active Problem List   Diagnosis Date Noted   Chlamydia 01/12/2021   Low back pain 12/19/2020   Marijuana use 12/19/2020   HTN (hypertension) 05/10/2020   Rectal lesion 05/10/2020   Morbid obesity (HCC) 03/31/2018   HIV disease (HCC) 07/08/2014   Syphilis 07/08/2014   Gonorrhea 07/08/2014   Hypertriglyceridemia 07/08/2014    Patient's Medications  New Prescriptions   No medications on file  Previous Medications   CYCLOBENZAPRINE (FLEXERIL) 10 MG TABLET    Take 1 tablet (10 mg total) by mouth 2 (two) times daily as needed for muscle spasms.   DARUNAVIR-COBICISCTAT-EMTRICITABINE-TENOFOVIR ALAFENAMIDE (SYMTUZA) 800-150-200-10 MG TABS    Take 1 tablet by mouth daily with breakfast.   IBUPROFEN (ADVIL) 600 MG TABLET    Take 1 tablet (600 mg total) by mouth every 6 (six) hours as needed.   TRIAMCINOLONE CREAM (KENALOG) 0.1 %    Apply 1 application topically 2 (two) times daily.  Modified Medications   No medications on file  Discontinued Medications   No medications on file    Allergies: No Known Allergies  Past Medical History: Past Medical History:  Diagnosis Date   Chlamydia 01/12/2021   HIV disease (HCC)    Low back pain 12/19/2020   Marijuana use 12/19/2020   Mononucleosis    Syphilis     Social History: Social History   Socioeconomic History   Marital status: Single    Spouse name: Not on file   Number of children: Not on file   Years of education: Not on file   Highest education level: Not on file  Occupational History   Not on file  Tobacco Use   Smoking status: Former   Smokeless tobacco: Never  Substance and Sexual Activity   Alcohol use: Not Currently    Comment: "on occassion"   Drug use: Yes    Types: Marijuana   Sexual activity: Yes    Partners: Male    Birth control/protection: Condom    Comment: declined condoms  Other Topics Concern    Not on file  Social History Narrative   Not on file   Social Determinants of Health   Financial Resource Strain: Not on file  Food Insecurity: Not on file  Transportation Needs: Not on file  Physical Activity: Not on file  Stress: Not on file  Social Connections: Not on file    Labs: Lab Results  Component Value Date   HIV1RNAQUANT 247 (H) 01/12/2021   HIV1RNAQUANT 66,800 (H) 12/19/2020   HIV1RNAQUANT 92,100 (H) 12/16/2020   CD4TABS 366 (L) 01/12/2021   CD4TABS 487 05/10/2020   CD4TABS 441 09/17/2019    RPR and STI Lab Results  Component Value Date   LABRPR REACTIVE (A) 01/12/2021   LABRPR REACTIVE (A) 12/16/2020   LABRPR REACTIVE (A) 05/10/2020   LABRPR REACTIVE (A) 09/17/2019   LABRPR REACTIVE (A) 07/15/2018   RPRTITER 1:16 (H) 01/12/2021   RPRTITER 1:8 (H) 12/16/2020   RPRTITER 1:16 (H) 05/10/2020   RPRTITER 1:16 (H) 09/17/2019   RPRTITER 1:32 (H) 07/15/2018    STI Results GC GC CT CT  Latest Ref Rng & Units - NEGATIVE - NEGATIVE  01/12/2021 Negative - Negative -  05/25/2020 Positive(A) - Positive(A) -  05/25/2020 Negative - Negative -  09/17/2019 Negative - Negative -  03/17/2018 Negative - Negative -  02/18/2018 Negative - Negative -  10/22/2016  Negative - Negative -  08/08/2016 **POSITIVE**(A) - Negative -  05/14/2014 - POSITIVE(A) - NEGATIVE    Hepatitis B Lab Results  Component Value Date   HEPBSAB NEG 05/20/2014   HEPBSAG NEGATIVE 05/20/2014   HEPBCAB NON REACTIVE 05/20/2014   Hepatitis C No results found for: HEPCAB, HCVRNAPCRQN Hepatitis A Lab Results  Component Value Date   HAV NON REACTIVE 05/20/2014   Lipids: Lab Results  Component Value Date   CHOL 379 (H) 12/16/2020   TRIG 532 (H) 12/16/2020   HDL 41 12/16/2020   CHOLHDL 9.2 (H) 12/16/2020   VLDL 67 (H) 10/22/2016   LDLCALC  12/16/2020     Comment:     . LDL cholesterol not calculated. Triglyceride levels greater than 400 mg/dL invalidate calculated LDL results. . Reference  range: <100 . Desirable range <100 mg/dL for primary prevention;   <70 mg/dL for patients with CHD or diabetic patients  with > or = 2 CHD risk factors. Marland Kitchen LDL-C is now calculated using the Martin-Hopkins  calculation, which is a validated novel method providing  better accuracy than the Friedewald equation in the  estimation of LDL-C.  Horald Pollen et al. Lenox Ahr. 6301;601(09): 2061-2068  (http://education.[facility].com/faq/FAQ164)     Current HIV Regimen: Symtuza  Assessment: Candon presents to clinic today for HIV follow-up. His most recent HIV RNA is 247 (slightly detectable but much improved) and CD4 count is 366. He was confused about his new regimen with Symtuza, and we reviewed the main counseling points for the medication again. Discussed that he must take the tablet with food everyday and to continue taking it every single day. He could not remember when he ran out of medicine exactly, but he said he took all 30 days of Symtuza from his August appointment. This would mean he has been out of medicine for ~2 weeks. He thought the pharmacy would tell him when his medicine is ready and when his [name] was approved. Discussed that he must contact them and keep up with his medicine because it is very important to control his HIV. Also confirmed that his [name] is activated now and that he already has an active prescription on file from July with 5 refills. Provided him 1 month of Symtuza samples to ensure he has a bridge to getting his prescription over the next months. He verbalized understanding, and this should be emphasized at his next appointment as well. Will check HIV RNA today given lack of adherence over the past 2 weeks. Will have him follow-up with Dr. Daiva Eves in 2 months.  Patient also states he felt like our office never answered the phone when he would call to reschedule appointments leading to being labeled as "no show". Helped set his MyChart up and told him to  message Dr. Daiva Eves or the pharmacy team when he needs to reschedule if he feels that no one is answering the phone.   [name] also complained of some dry skin on his penis today that might have lead to an ulcer. Only noted dry skin on examination. Patient has not been sexually active recently and always uses condoms in sexual encounters. He denies any burning upon urination or discharge. Will collect urine cytology today. He also asked about his recent RPR results (1:16 from 1:8). Discussed that [facility] stated he did not need treatment due a very small titer increase and no signs of infection. He was confused, and I stated he did not have syphilis at this time.  He asked for another RPR and discussed that it would probably not be different but that we could collect one.  Offered the flu shot and monkeypox shot to patient, and he deferred both.  Plan: Check HIV RNA, CD4, urine cytology, and RPR Continue Symtuza Follow-up with Dr. Daiva Eves on 11/16  Margarite Gouge, PharmD, CPP Clinical Pharmacist Practitioner Infectious Diseases Clinical Pharmacist [facility] 03/01/2021, 2:10 PM

## 2021-03-01 NOTE — Patient Instructions (Signed)
Please call [facility] at [facility] saying you needa refill of your Symtuza. Thank you!

## 2021-03-02 ENCOUNTER — Other Ambulatory Visit: Payer: Self-pay | Admitting: Pharmacist

## 2021-03-02 DIAGNOSIS — B20 Human immunodeficiency virus [HIV] disease: Secondary | ICD-10-CM

## 2021-03-02 LAB — URINE CYTOLOGY ANCILLARY ONLY
Chlamydia: NEGATIVE
Comment: NEGATIVE
Comment: NORMAL
Neisseria Gonorrhea: NEGATIVE

## 2021-03-02 MED ORDER — SYMTUZA 800-150-200-10 MG PO TABS: 1.0000 | ORAL_TABLET | Freq: Every day | ORAL | 0 refills | Status: AC

## 2021-03-02 NOTE — Progress Notes (Signed)
Medication Samples have been provided to the patient.  Drug name: Symtuza        Strength: 800/150/200/10 mg Qty: 30 tablets (1 bottle)  LOT: [id number]   Exp.Date: 11/10/2022  Dosing instructions: Take one tablet by mouth once daily with food  The patient has been instructed regarding the correct time, dose, and frequency of taking this medication, including desired effects and most common side effects.   Jaymin Waln L. Tniya Bowditch, PharmD, BCIDP, AAHIVP, CPP Clinical Pharmacist Practitioner Infectious Diseases Clinical Pharmacist [facility] 05/23/2020, 10:07 AM  

## 2021-03-03 LAB — FLUORESCENT TREPONEMAL AB(FTA)-IGG-BLD: Fluorescent Treponemal ABS: REACTIVE — AB

## 2021-03-03 LAB — HIV-1 RNA QUANT-NO REFLEX-BLD
HIV 1 RNA Quant: 35000 Copies/mL — ABNORMAL HIGH
HIV-1 RNA Quant, Log: 4.54 Log cps/mL — ABNORMAL HIGH

## 2021-03-03 LAB — RPR TITER: RPR Titer: 1:16 {titer} — ABNORMAL HIGH

## 2021-03-03 LAB — T-HELPER CELLS (CD4) COUNT (NOT AT ARMC)
CD4 % Helper T Cell: 27 % — ABNORMAL LOW (ref 33–65)
CD4 T Cell Abs: 491 /uL (ref 400–1790)

## 2021-03-03 LAB — RPR: RPR Ser Ql: REACTIVE — AB

## 2021-04-26 ENCOUNTER — Ambulatory Visit: Payer: Self-pay | Admitting: Infectious Disease

## 2021-04-27 ENCOUNTER — Ambulatory Visit: Payer: Self-pay | Admitting: Infectious Disease

## 2021-04-27 ENCOUNTER — Other Ambulatory Visit: Payer: Self-pay

## 2021-04-27 ENCOUNTER — Encounter: Payer: Self-pay | Admitting: Infectious Disease

## 2021-04-27 VITALS — BP 117/76 | HR 69 | Temp 98.2°F | Wt 303.0 lb

## 2021-04-27 DIAGNOSIS — I10 Essential (primary) hypertension: Secondary | ICD-10-CM

## 2021-04-27 DIAGNOSIS — B20 Human immunodeficiency virus [HIV] disease: Secondary | ICD-10-CM

## 2021-04-27 MED ORDER — SYMTUZA 800-150-200-10 MG PO TABS: 1.0000 | ORAL_TABLET | Freq: Every day | ORAL | 5 refills | Status: DC

## 2021-04-27 NOTE — Progress Notes (Signed)
Subjective:  Chief complaint: f follow-up for HIV disease on SYMTUZA   Patient ID: Seth Taylor, male    DOB: 1995/10/10, 25 y.o.   MRN: [id number]  HPI  25 year old black man with history of HIV disease initially diagnosed in 2016 at which point time he was started on Genvoya.  He also had syphilis at that time.  He initially maintained nice virological control with an undetectable viral load in March 14 2016th November 30 2016th Nov 03, 2015.  He then became viremic in October 2019 with a viral load 24,800.    Load came down to 352 in March 31, 2018.  He became viremic again by February 2020 and has been viremic since then.  Talking with him extensively at one of my visitswith him rhe told me that he had stopped taking his antiretrovirals in part because he had seen a television commercial stating that antivirals were bad for people.  I suspect this with the notorious Truvada commercials that have caused so much damage to many of our patients.  I did not think that he should be on Genvoya given his poor recent adherence.  I switched him over to [facility].   I offered him the opportunity to enroll in the LATTITUDE study and at the time he seemed interested in this but this is no longer the case.  Claims he is taking the SYMTUZA from the samples that I gave him but he has not gone to [facility] to pick up the prescribed bottles of this antiviral.  He claims to not be having much in the way of side effects other than some nausea early in the morning.      Past Medical History:  Diagnosis Date   Chlamydia 01/12/2021   HIV disease (HCC)    Low back pain 12/19/2020   Marijuana use 12/19/2020   Mononucleosis    Syphilis     No past surgical history on file.  Family History  Problem Relation Age of Onset   CVA Mother        seconday to drug use   Diabetes Father    Hepatitis C Father       Social History   Socioeconomic History   Marital status: Single    Spouse name:  Not on file   Number of children: Not on file   Years of education: Not on file   Highest education level: Not on file  Occupational History   Not on file  Tobacco Use   Smoking status: Former   Smokeless tobacco: Never  Substance and Sexual Activity   Alcohol use: Not Currently    Comment: "on occassion"   Drug use: Yes    Types: Marijuana   Sexual activity: Yes    Partners: Male    Birth control/protection: Condom    Comment: declined condoms  Other Topics Concern   Not on file  Social History Narrative   Not on file   Social Determinants of Health   Financial Resource Strain: Not on file  Food Insecurity: Not on file  Transportation Needs: Not on file  Physical Activity: Not on file  Stress: Not on file  Social Connections: Not on file    No Known Allergies   Current Outpatient Medications:    Darunavir-Cobicisctat-Emtricitabine-Tenofovir Alafenamide (SYMTUZA) 800-150-200-10 MG TABS, Take 1 tablet by mouth daily with breakfast., Disp: 30 tablet, Rfl: 5   cyclobenzaprine (FLEXERIL) 10 MG tablet, Take 1 tablet (10 mg total) by mouth 2 (two) times daily  as needed for muscle spasms. (Patient not taking: Reported on 12/19/2020), Disp: 20 tablet, Rfl: 0   ibuprofen (ADVIL) 600 MG tablet, Take 1 tablet (600 mg total) by mouth every 6 (six) hours as needed. (Patient not taking: Reported on 12/19/2020), Disp: 30 tablet, Rfl: 0   triamcinolone cream (KENALOG) 0.1 %, Apply 1 application topically 2 (two) times daily. (Patient not taking: Reported on 12/19/2020), Disp: 45 g, Rfl: 0   Review of Systems  Constitutional:  Negative for activity change, appetite change, chills, diaphoresis, fatigue, fever and unexpected weight change.  HENT:  Negative for congestion, rhinorrhea, sinus pressure, sneezing, sore throat and trouble swallowing.   Eyes:  Negative for photophobia and visual disturbance.  Respiratory:  Negative for cough, chest tightness, shortness of breath, wheezing and  stridor.   Cardiovascular:  Negative for chest pain, palpitations and leg swelling.  Gastrointestinal:  Negative for abdominal distention, abdominal pain, anal bleeding, blood in stool, constipation, diarrhea, nausea and vomiting.  Genitourinary:  Negative for difficulty urinating, dysuria, flank pain and hematuria.  Musculoskeletal:  Negative for arthralgias, back pain, gait problem, joint swelling and myalgias.  Skin:  Negative for color change, pallor, rash and wound.  Neurological:  Negative for dizziness, tremors, weakness and light-headedness.  Hematological:  Negative for adenopathy. Does not bruise/bleed easily.  Psychiatric/Behavioral:  Negative for agitation, behavioral problems, confusion, decreased concentration, dysphoric mood and sleep disturbance.       Objective:   Physical Exam Constitutional:      Appearance: He is well-developed.  HENT:     Head: Normocephalic and atraumatic.  Eyes:     Conjunctiva/sclera: Conjunctivae normal.  Cardiovascular:     Rate and Rhythm: Normal rate and regular rhythm.  Pulmonary:     Effort: Pulmonary effort is normal. No respiratory distress.     Breath sounds: No wheezing.  Abdominal:     General: There is no distension.     Palpations: Abdomen is soft.  Musculoskeletal:        General: No tenderness. Normal range of motion.     Cervical back: Normal range of motion and neck supple.  Skin:    General: Skin is warm and dry.     Coloration: Skin is not pale.     Findings: No erythema or rash.  Neurological:     General: No focal deficit present.     Mental Status: He is alert and oriented to person, place, and time.  Psychiatric:        Mood and Affect: Mood normal.        Behavior: Behavior normal.        Thought Content: Thought content normal.        Judgment: Judgment normal.          Assessment & Plan:  HIV disease:  I will check an viral load and CD4 count CMP CBC with differential  I am continue SYMTUZA  prescription obesity continue efforts at weight loss   Obesity: continue efforts at weight loss including carbohydrate restriction  Hypertension history is completely normotensive at present  Vitals:   04/27/21 1016  BP: 117/76  Pulse: 69  Temp: 98.2 F (36.8 C)  SpO2: 95%   I spent 41  minutes with the patient including face to face counseling of the patient regarding the nature of HIV infection and its origins the need for patients like him to be highly adherent to antiretroviral therapy to suppress the virus and protect the immune system and also render  him on infectious to other people i.e. undetectable equals on transmissible, reviewing his viral load trends genotype CD4 counts along with review of medical records before and during the visit and in coordination of his care.

## 2021-04-28 LAB — T-HELPER CELL (CD4) - (RCID CLINIC ONLY)
CD4 % Helper T Cell: 25 % — ABNORMAL LOW (ref 33–65)
CD4 T Cell Abs: 456 /uL (ref 400–1790)

## 2021-05-11 LAB — HIV-1 GENOTYPE: HIV-1 Genotype: DETECTED — AB

## 2021-05-11 LAB — HIV-1 RNA ULTRAQUANT REFLEX TO GENTYP+
HIV 1 RNA Quant: 36700 copies/mL — ABNORMAL HIGH
HIV-1 RNA Quant, Log: 4.56 Log copies/mL — ABNORMAL HIGH

## 2021-05-11 LAB — COMPLETE METABOLIC PANEL WITH GFR
AG Ratio: 1.4 (calc) (ref 1.0–2.5)
ALT: 30 U/L (ref 9–46)
AST: 24 U/L (ref 10–40)
Albumin: 4.1 g/dL (ref 3.6–5.1)
Alkaline phosphatase (APISO): 70 U/L (ref 36–130)
BUN: 7 mg/dL (ref 7–25)
CO2: 31 mmol/L (ref 20–32)
Calcium: 9 mg/dL (ref 8.6–10.3)
Chloride: 104 mmol/L (ref 98–110)
Creat: 0.83 mg/dL (ref 0.60–1.24)
Globulin: 3 g/dL (calc) (ref 1.9–3.7)
Glucose, Bld: 82 mg/dL (ref 65–99)
Potassium: 3.4 mmol/L — ABNORMAL LOW (ref 3.5–5.3)
Sodium: 142 mmol/L (ref 135–146)
Total Bilirubin: 0.4 mg/dL (ref 0.2–1.2)
Total Protein: 7.1 g/dL (ref 6.1–8.1)
eGFR: 125 mL/min/{1.73_m2} (ref >=60)

## 2021-05-11 LAB — CBC WITH DIFFERENTIAL/PLATELET
Absolute Monocytes: 332 cells/uL (ref 200–950)
Basophils Absolute: 29 cells/uL (ref 0–200)
Basophils Relative: 0.7 %
Eosinophils Absolute: 62 cells/uL (ref 15–500)
Eosinophils Relative: 1.5 %
HCT: 45.6 % (ref 38.5–50.0)
Hemoglobin: 15.7 g/dL (ref 13.2–17.1)
Lymphs Abs: 1976 cells/uL (ref 850–3900)
MCH: 27.9 pg (ref 27.0–33.0)
MCHC: 34.4 g/dL (ref 32.0–36.0)
MCV: 81.1 fL (ref 80.0–100.0)
MPV: 10.8 fL (ref 7.5–12.5)
Monocytes Relative: 8.1 %
Neutro Abs: 1702 cells/uL (ref 1500–7800)
Neutrophils Relative %: 41.5 %
Platelets: 236 10*3/uL (ref 140–400)
RBC: 5.62 10*6/uL (ref 4.20–5.80)
RDW: 13.7 % (ref 11.0–15.0)
Total Lymphocyte: 48.2 %
WBC: 4.1 10*3/uL (ref 3.8–10.8)

## 2021-06-22 ENCOUNTER — Ambulatory Visit: Payer: Medicaid Other | Admitting: Infectious Diseases

## 2021-06-22 ENCOUNTER — Ambulatory Visit: Payer: Self-pay

## 2021-07-11 ENCOUNTER — Other Ambulatory Visit: Payer: Self-pay

## 2021-07-11 ENCOUNTER — Emergency Department (HOSPITAL_COMMUNITY)
Admission: EM | Admit: 2021-07-11 | Discharge: 2021-07-11 | Discharge status: Against Medical Advice | Payer: Medicaid Other

## 2021-07-11 NOTE — ED Notes (Signed)
No response x3 

## 2021-07-31 ENCOUNTER — Other Ambulatory Visit (HOSPITAL_COMMUNITY): Payer: Self-pay

## 2021-08-03 ENCOUNTER — Ambulatory Visit: Payer: Medicaid Other | Admitting: Infectious Diseases

## 2021-08-03 ENCOUNTER — Ambulatory Visit: Payer: Self-pay

## 2021-08-24 ENCOUNTER — Telehealth: Payer: Self-pay

## 2021-08-24 NOTE — Telephone Encounter (Signed)
Called patient to check in, assess medication adherence, and schedule overdue follow up. Patient reports that he has not had any problems obtaining his Symtuza from the pharmacy and denies any missed doses.  ? ?Per chart, medication was last dispensed 02/2021. Scheduled patient for follow up with provider and financial counselor to renew UMAP. Emphasized importance of keeping this appointment as his UMAP will expire at the end of the month. Patient verbalized understanding and has no further questions.  ? ?Sandie Ano, RN ? ?

## 2021-08-29 ENCOUNTER — Ambulatory Visit: Payer: Medicaid Other

## 2021-08-29 ENCOUNTER — Ambulatory Visit: Payer: Medicaid Other | Admitting: Infectious Disease

## 2021-09-18 ENCOUNTER — Telehealth: Payer: Self-pay

## 2021-09-18 NOTE — Telephone Encounter (Signed)
Reached out to patient due to multiple no show previous appointments. Patient agrees to see Pharm-D for medication adherence. Patient will also need to see financial team for renewal and labs.  ?

## 2021-09-18 NOTE — Telephone Encounter (Signed)
Thank you :)

## 2021-09-19 ENCOUNTER — Ambulatory Visit: Payer: Self-pay

## 2021-09-20 ENCOUNTER — Ambulatory Visit: Payer: Self-pay | Admitting: Pharmacist

## 2021-11-01 ENCOUNTER — Encounter: Payer: Self-pay | Admitting: Infectious Diseases

## 2022-02-28 ENCOUNTER — Telehealth: Payer: Self-pay

## 2022-02-28 NOTE — Telephone Encounter (Signed)
Patient's cousin Beckie Busing called stating that patient is currently incarcerated at [facility] and patient states he has not been receiving his medication.  I advised Monique I could not speak with her due to HIPPA and she is not listed as one of the people we can speak with on the patient's behalf.   I attempted to contact Dionne with [facility] and was unable to connect with her to discuss the patient. I left a voicemail for Dionne to call our office back. Cyan Clippinger T Brooks Sailors

## 2022-03-01 NOTE — Telephone Encounter (Signed)
Left 2nd voicemail for Seth Taylor at phone number [phone] requesting call back

## 2022-03-02 NOTE — Telephone Encounter (Signed)
Left a 3rd voicemail with Dionne requesting a call back. Kisa Fujii T Brooks Sailors

## 2022-03-05 ENCOUNTER — Telehealth: Payer: Self-pay

## 2022-03-05 NOTE — Telephone Encounter (Signed)
error 

## 2022-03-05 NOTE — Telephone Encounter (Signed)
Dionne with [facility] was able to call office back regarding patient. States that patient has not been on any medication since he was brought in in August. Is scheduled to see Dr. Linus Salmons this Friday at 1030. Leatrice Jewels, RMA

## 2022-03-05 NOTE — Telephone Encounter (Signed)
Attempted to reach [place] [phone]) at [facility]. No answer. Did not leave additional message.  Binnie Kand, RN

## 2022-03-09 ENCOUNTER — Ambulatory Visit (INDEPENDENT_AMBULATORY_CARE_PROVIDER_SITE_OTHER): Admitting: Internal Medicine

## 2022-03-09 ENCOUNTER — Other Ambulatory Visit: Payer: Self-pay

## 2022-03-09 ENCOUNTER — Encounter: Payer: Self-pay | Admitting: Internal Medicine

## 2022-03-09 ENCOUNTER — Telehealth: Payer: Self-pay

## 2022-03-09 VITALS — BP 134/97 | HR 72 | Temp 98.3°F | Ht 71.0 in | Wt 275.0 lb

## 2022-03-09 DIAGNOSIS — M545 Low back pain, unspecified: Secondary | ICD-10-CM | POA: Diagnosis not present

## 2022-03-09 DIAGNOSIS — G8929 Other chronic pain: Secondary | ICD-10-CM

## 2022-03-09 DIAGNOSIS — E781 Pure hyperglyceridemia: Secondary | ICD-10-CM | POA: Diagnosis not present

## 2022-03-09 DIAGNOSIS — Z113 Encounter for screening for infections with a predominantly sexual mode of transmission: Secondary | ICD-10-CM

## 2022-03-09 DIAGNOSIS — B20 Human immunodeficiency virus [HIV] disease: Secondary | ICD-10-CM | POA: Diagnosis not present

## 2022-03-09 MED ORDER — SYMTUZA 800-150-200-10 MG PO TABS: 1.0000 | ORAL_TABLET | Freq: Every day | ORAL | 5 refills | Status: DC

## 2022-03-09 NOTE — Assessment & Plan Note (Signed)
unfortuately off of medication but will engage [facility] to get him signed up and back on at the jail.  Will start [place] again and get in back in about 2 months.  Will check his labs today as well.

## 2022-03-09 NOTE — Telephone Encounter (Signed)
Called and spoke with Sharyn Lull, Therapist, sports at [facility]. Reviewed Consulting Physician's report from office visit today. Relayed order per Dr. Linus Salmons to start Symtuza 1 pill PO daily, and return in 2 months. Sharyn Lull verbalized understanding. All questions answered.  Binnie Kand, RN

## 2022-03-09 NOTE — Progress Notes (Signed)
   Subjective:    Patient ID: Seth Taylor, male    DOB: December 25, 1995, 26 y.o.   MRN: [id number]  HPI Aquarius is here for follow up of HIV He had been on Symtuza after switching from [place] but has not been getting it while in [facility], now for several weeks.  He previously was taking it daily.  He had noted some back pain while on the Genvoya he equated with TV commercials about side effects from TDF and did not experience the pain off the medication.  Nada Libman is a much better though he does still experience some pain with it and ok with taking it.  He had been cutting his Genvoya in half to reduce the back pain.    Review of Systems  Constitutional:  Negative for fatigue.  Gastrointestinal:  Negative for diarrhea.  Skin:  Negative for rash.       Objective:   Physical Exam Eyes:     General: No scleral icterus. Pulmonary:     Effort: Pulmonary effort is normal.  Neurological:     Mental Status: He is alert.  Psychiatric:        Mood and Affect: Mood normal.   SH: currently resides in [facility] jail        Assessment & Plan:

## 2022-03-09 NOTE — Assessment & Plan Note (Signed)
Will check his lipid panel 

## 2022-03-09 NOTE — Assessment & Plan Note (Signed)
Will screen.  He denies any recent sexual activity

## 2022-03-14 LAB — CBC WITH DIFFERENTIAL/PLATELET
Absolute Monocytes: 288 {cells}/uL (ref 200–950)
Basophils Absolute: 22 {cells}/uL (ref 0–200)
Basophils Relative: 0.6 %
Eosinophils Absolute: 40 {cells}/uL (ref 15–500)
Eosinophils Relative: 1.1 %
HCT: 48.1 % (ref 38.5–50.0)
Hemoglobin: 16.3 g/dL (ref 13.2–17.1)
Lymphs Abs: 1577 {cells}/uL (ref 850–3900)
MCH: 26.8 pg — ABNORMAL LOW (ref 27.0–33.0)
MCHC: 33.9 g/dL (ref 32.0–36.0)
MCV: 79.1 fL — ABNORMAL LOW (ref 80.0–100.0)
MPV: 10.2 fL (ref 7.5–12.5)
Monocytes Relative: 8 %
Neutro Abs: 1674 {cells}/uL (ref 1500–7800)
Neutrophils Relative %: 46.5 %
Platelets: 260 10*3/uL (ref 140–400)
RBC: 6.08 Million/uL — ABNORMAL HIGH (ref 4.20–5.80)
RDW: 13.7 % (ref 11.0–15.0)
Total Lymphocyte: 43.8 %
WBC: 3.6 10*3/uL — ABNORMAL LOW (ref 3.8–10.8)

## 2022-03-14 LAB — RPR: RPR Ser Ql: REACTIVE — AB

## 2022-03-14 LAB — COMPLETE METABOLIC PANEL WITH GFR
AG Ratio: 1 (calc) (ref 1.0–2.5)
ALT: 18 U/L (ref 9–46)
AST: 17 U/L (ref 10–40)
Albumin: 4.2 g/dL (ref 3.6–5.1)
Alkaline phosphatase (APISO): 80 U/L (ref 36–130)
BUN: 7 mg/dL (ref 7–25)
CO2: 28 mmol/L (ref 20–32)
Calcium: 9.4 mg/dL (ref 8.6–10.3)
Chloride: 104 mmol/L (ref 98–110)
Creat: 0.84 mg/dL (ref 0.60–1.24)
Globulin: 4.2 g/dL (calc) — ABNORMAL HIGH (ref 1.9–3.7)
Glucose, Bld: 84 mg/dL (ref 65–99)
Potassium: 4 mmol/L (ref 3.5–5.3)
Sodium: 140 mmol/L (ref 135–146)
Total Bilirubin: 0.5 mg/dL (ref 0.2–1.2)
Total Protein: 8.4 g/dL — ABNORMAL HIGH (ref 6.1–8.1)
eGFR: 123 mL/min/{1.73_m2} (ref >=60)

## 2022-03-14 LAB — T-HELPER CELLS (CD4) COUNT (NOT AT ARMC)
Absolute CD4: 415 cells/uL — ABNORMAL LOW (ref 490–1740)
CD4 T Helper %: 23 % — ABNORMAL LOW (ref 30–61)
Total lymphocyte count: 1789 cells/uL (ref 850–3900)

## 2022-03-14 LAB — LIPID PANEL
Cholesterol: 172 mg/dL (ref <200)
HDL: 37 mg/dL — ABNORMAL LOW (ref >=40)
LDL Cholesterol (Calc): 90 mg/dL (calc)
Non-HDL Cholesterol (Calc): 135 mg/dL (calc) — ABNORMAL HIGH (ref <130)
Total CHOL/HDL Ratio: 4.6 (calc) (ref <5.0)
Triglycerides: 335 mg/dL — ABNORMAL HIGH (ref <150)

## 2022-03-14 LAB — HIV-1 RNA QUANT-NO REFLEX-BLD
HIV 1 RNA Quant: 42400 Copies/mL — ABNORMAL HIGH
HIV-1 RNA Quant, Log: 4.63 Log cps/mL — ABNORMAL HIGH

## 2022-03-14 LAB — FLUORESCENT TREPONEMAL AB(FTA)-IGG-BLD: Fluorescent Treponemal ABS: REACTIVE — AB

## 2022-03-14 LAB — RPR TITER: RPR Titer: 1:16 {titer} — ABNORMAL HIGH

## 2022-04-26 ENCOUNTER — Telehealth: Payer: Self-pay

## 2022-04-26 NOTE — Telephone Encounter (Signed)
Detectable Viral Load Intervention   Most recent VL:  Lab Results  Component Value Date   HIV1RNAQUANT 42,400 (H) 03/09/2022     Current ART regimen: Symtuza  Appointment status: patient does not have future appointment scheduled   Current barriers to appointments: patient currently in jail.  Interventions: patient currently in jail, left voicemail and will fax [facility] to request appointment. [place] P: [phone], F: [phone]

## 2022-04-26 NOTE — Telephone Encounter (Signed)
Fax sent to [facility] Department requesting callback to schedule appointment.   Sandie Ano, RN

## 2022-05-11 DIAGNOSIS — Z419 Encounter for procedure for purposes other than remedying health state, unspecified: Secondary | ICD-10-CM | POA: Diagnosis not present

## 2022-06-11 DIAGNOSIS — Z419 Encounter for procedure for purposes other than remedying health state, unspecified: Secondary | ICD-10-CM | POA: Diagnosis not present

## 2022-08-01 ENCOUNTER — Telehealth: Payer: Self-pay

## 2022-08-01 NOTE — Telephone Encounter (Signed)
Mychart msg sent

## 2022-08-29 ENCOUNTER — Other Ambulatory Visit: Payer: Self-pay

## 2022-08-29 ENCOUNTER — Ambulatory Visit (INDEPENDENT_AMBULATORY_CARE_PROVIDER_SITE_OTHER): Admitting: Internal Medicine

## 2022-08-29 ENCOUNTER — Encounter: Payer: Self-pay | Admitting: Internal Medicine

## 2022-08-29 VITALS — BP 125/86 | HR 68 | Resp 16 | Ht 71.0 in | Wt 275.0 lb

## 2022-08-29 DIAGNOSIS — I10 Essential (primary) hypertension: Secondary | ICD-10-CM

## 2022-08-29 DIAGNOSIS — B20 Human immunodeficiency virus [HIV] disease: Secondary | ICD-10-CM | POA: Diagnosis not present

## 2022-08-29 DIAGNOSIS — A539 Syphilis, unspecified: Secondary | ICD-10-CM

## 2022-08-29 DIAGNOSIS — Z113 Encounter for screening for infections with a predominantly sexual mode of transmission: Secondary | ICD-10-CM

## 2022-08-29 NOTE — Assessment & Plan Note (Signed)
Doing well on Symtuza now and I anticipate better viral suppression.  Labs today and he can follow up in 3-4 months.

## 2022-08-29 NOTE — Assessment & Plan Note (Signed)
BP wnl.   

## 2022-08-29 NOTE — Assessment & Plan Note (Signed)
I will recheck his titer which has been stable.

## 2022-08-29 NOTE — Progress Notes (Signed)
   Subjective:    Patient ID: Seth Taylor, male    DOB: March 08, 1996, 27 y.o.   MRN: [id number]  HPI Seth Taylor is here for follow up of HIV He was been on Symtuza and taking well.  He continues to reside in [facility].  No new complaints.     Review of Systems  Constitutional:  Negative for fatigue.  Gastrointestinal:  Negative for diarrhea and nausea.  Skin:  Negative for rash.       Objective:   Physical Exam Eyes:     General: No scleral icterus. Pulmonary:     Effort: Pulmonary effort is normal.  Neurological:     Mental Status: He is alert.   SH: no tobacco        Assessment & Plan:

## 2022-08-31 LAB — COMPLETE METABOLIC PANEL WITH GFR
AG Ratio: 1 (calc) (ref 1.0–2.5)
ALT: 11 U/L (ref 9–46)
AST: 12 U/L (ref 10–40)
Albumin: 4.2 g/dL (ref 3.6–5.1)
Alkaline phosphatase (APISO): 86 U/L (ref 36–130)
BUN: 9 mg/dL (ref 7–25)
CO2: 26 mmol/L (ref 20–32)
Calcium: 9.7 mg/dL (ref 8.6–10.3)
Chloride: 102 mmol/L (ref 98–110)
Creat: 0.95 mg/dL (ref 0.60–1.24)
Globulin: 4.3 g/dL (calc) — ABNORMAL HIGH (ref 1.9–3.7)
Glucose, Bld: 78 mg/dL (ref 65–99)
Potassium: 3.7 mmol/L (ref 3.5–5.3)
Sodium: 139 mmol/L (ref 135–146)
Total Bilirubin: 0.8 mg/dL (ref 0.2–1.2)
Total Protein: 8.5 g/dL — ABNORMAL HIGH (ref 6.1–8.1)
eGFR: 113 mL/min/{1.73_m2} (ref >=60)

## 2022-08-31 LAB — RPR TITER: RPR Titer: 1:8 {titer} — ABNORMAL HIGH

## 2022-08-31 LAB — T PALLIDUM AB: T Pallidum Abs: POSITIVE — AB

## 2022-08-31 LAB — HIV-1 RNA QUANT-NO REFLEX-BLD
HIV 1 RNA Quant: 887 Copies/mL — ABNORMAL HIGH
HIV-1 RNA Quant, Log: 2.95 Log cps/mL — ABNORMAL HIGH

## 2022-08-31 LAB — RPR: RPR Ser Ql: REACTIVE — AB

## 2022-09-03 ENCOUNTER — Telehealth: Payer: Self-pay

## 2022-09-03 NOTE — Telephone Encounter (Signed)
-----   Message from Jabier Mutton, MD sent at 09/03/2022  9:59 AM EDT ----- Viral load 5 months out from restarting medication still higher than expected  Dr Linus Salmons had wanted to see in around 3 months. Please make sure he has follow up setup. thanks

## 2022-09-03 NOTE — Telephone Encounter (Signed)
Called patient to schedule follow up, no answer. Left HIPAA compliant voicemail requesting callback.  ? ?Markeria Goetsch D Linzee Depaul, RN ? ?

## 2022-09-14 ENCOUNTER — Telehealth: Payer: Self-pay

## 2022-09-14 NOTE — Telephone Encounter (Signed)
Patient is no longer in the custody of the [facility] center. Left patient a voice mail on mobile to call back to schedule a 3 month follow up with Dr. Luciana Axe.

## 2022-11-22 ENCOUNTER — Telehealth: Payer: Self-pay

## 2022-11-22 NOTE — Telephone Encounter (Signed)
Detectable Viral Load Intervention   Most recent VL:  HIV 1 RNA Quant  Date Value Ref Range Status  08/29/2022 887 (H) Copies/mL Final  03/09/2022 42,400 (H) Copies/mL Final  04/27/2021 36,700 (H) copies/mL Final    Last Clinic Visit: 08/29/22  Current ART regimen: Symtuza  Appointment status: patient has future appointment scheduled  Medication last dispensed (per chart review):   Dispensed Days Supply Quantity Provider Pharmacy  [facility] TABLETS 04/06/2022 30 30 each Gardiner Barefoot, MD [facility]...  [facility] TABLETS 03/14/2022 30 30 each Gardiner Barefoot, MD [facility]cie [facility]...  [facility] TABLETS 03/01/2021 30 30 each Daiva Eves, Lisette Grinder, MD [facility] #...    Medication Adherence   Unable to assess.    Barriers to Care   Unable to assess.   Interventions   Called patient to discuss medication adherence and possible barriers to care, no answer. Left HIPAA compliant voicemail requesting callback.   Sandie Ano, RN

## 2022-12-18 ENCOUNTER — Ambulatory Visit: Payer: Medicaid Other | Admitting: Internal Medicine

## 2022-12-26 ENCOUNTER — Other Ambulatory Visit: Payer: Self-pay

## 2022-12-26 ENCOUNTER — Other Ambulatory Visit (HOSPITAL_COMMUNITY): Payer: Self-pay

## 2022-12-26 ENCOUNTER — Ambulatory Visit (INDEPENDENT_AMBULATORY_CARE_PROVIDER_SITE_OTHER): Payer: Medicaid Other | Admitting: Pharmacist

## 2022-12-26 ENCOUNTER — Other Ambulatory Visit (HOSPITAL_COMMUNITY)
Admission: RE | Admit: 2022-12-26 | Discharge: 2022-12-26 | Disposition: A | Discharge status: Home / Self Care | Payer: Medicaid Other | Source: Ambulatory Visit | Attending: Infectious Disease | Admitting: Infectious Disease

## 2022-12-26 DIAGNOSIS — Z23 Encounter for immunization: Secondary | ICD-10-CM

## 2022-12-26 DIAGNOSIS — Z113 Encounter for screening for infections with a predominantly sexual mode of transmission: Secondary | ICD-10-CM

## 2022-12-26 DIAGNOSIS — B20 Human immunodeficiency virus [HIV] disease: Secondary | ICD-10-CM

## 2022-12-26 MED ORDER — CEFTRIAXONE SODIUM 500 MG IJ SOLR
500.0000 mg | Freq: Once | INTRAMUSCULAR | Status: AC
  Administered 2022-12-26: 500 mg via INTRAMUSCULAR

## 2022-12-26 MED ORDER — SYMTUZA 800-150-200-10 MG PO TABS: 1.0000 | ORAL_TABLET | Freq: Every day | ORAL | 1 refills | Status: DC

## 2022-12-26 MED ORDER — PENICILLIN G BENZATHINE 1200000 UNIT/2ML IM SUSY
2.4000 10*6.[IU] | PREFILLED_SYRINGE | Freq: Once | INTRAMUSCULAR | Status: AC
  Administered 2022-12-26: 2.4 10*6.[IU] via INTRAMUSCULAR

## 2022-12-26 NOTE — Patient Instructions (Signed)
Medicaid Management - [employer]  Recipient ID: [id number]

## 2022-12-26 NOTE — Progress Notes (Signed)
HPI: Seth Taylor is a 27 y.o. male who presents to the [facility] pharmacy clinic for STI testing.  Patient Active Problem List   Diagnosis Date Noted   Routine screening for STI (sexually transmitted infection) 03/09/2022   Chlamydia 01/12/2021   Low back pain 12/19/2020   Marijuana use 12/19/2020   HTN (hypertension) 05/10/2020   Rectal lesion 05/10/2020   Morbid obesity (HCC) 03/31/2018   HIV disease (HCC) 07/08/2014   Syphilis 07/08/2014   Gonorrhea 07/08/2014   Hypertriglyceridemia 07/08/2014    Patient's Medications  New Prescriptions   No medications on file  Previous Medications   No medications on file  Modified Medications   Modified Medication Previous Medication   DARUNAVIR-COBICISTAT-EMTRICITABINE-TENOFOVIR ALAFENAMIDE (SYMTUZA) 800-150-200-10 MG TABS Darunavir-Cobicistat-Emtricitabine-Tenofovir Alafenamide (SYMTUZA) 800-150-200-10 MG TABS      Take 1 tablet by mouth daily with breakfast.    Take 1 tablet by mouth daily with breakfast.  Discontinued Medications   No medications on file    Allergies: No Known Allergies  Past Medical History: Past Medical History:  Diagnosis Date   Chlamydia 01/12/2021   HIV disease (HCC)    Low back pain 12/19/2020   Marijuana use 12/19/2020   Mononucleosis    Syphilis     Social History: Social History   Socioeconomic History   Marital status: Single    Spouse name: Not on file   Number of children: Not on file   Years of education: Not on file   Highest education level: Not on file  Occupational History   Not on file  Tobacco Use   Smoking status: Former    Passive exposure: Never   Smokeless tobacco: Never  Substance and Sexual Activity   Alcohol use: Not Currently    Comment: "on occassion"   Drug use: Not Currently    Types: Marijuana    Comment: States last use approximately 1 month ago (03/09/22)   Sexual activity: Not Currently    Partners: Male    Birth control/protection: Condom    Comment:  declined condoms  Other Topics Concern   Not on file  Social History Narrative   Not on file   Social Determinants of Health   Financial Resource Strain: Not on file  Food Insecurity: Not on file  Transportation Needs: Not on file  Physical Activity: Not on file  Stress: Not on file  Social Connections: Not on file    Labs: Lab Results  Component Value Date   HIV1RNAQUANT 887 (H) 08/29/2022   HIV1RNAQUANT 42,400 (H) 03/09/2022   HIV1RNAQUANT 36,700 (H) 04/27/2021   CD4TABS 456 04/27/2021   CD4TABS 491 03/01/2021   CD4TABS 366 (L) 01/12/2021    RPR and STI Lab Results  Component Value Date   LABRPR REACTIVE (A) 08/29/2022   LABRPR REACTIVE (A) 03/09/2022   LABRPR REACTIVE (A) 03/01/2021   LABRPR REACTIVE (A) 01/12/2021   LABRPR REACTIVE (A) 12/16/2020   RPRTITER 1:8 (H) 08/29/2022   RPRTITER 1:16 (H) 03/09/2022   RPRTITER 1:16 (H) 03/01/2021   RPRTITER 1:16 (H) 01/12/2021   RPRTITER 1:8 (H) 12/16/2020    STI Results GC GC CT CT  Latest Ref Rng & Units  NEGATIVE  NEGATIVE  03/01/2021  2:29 PM Negative   Negative    01/12/2021  3:38 PM Negative   Negative    05/25/2020  2:59 PM Positive   Positive    05/25/2020  2:58 PM Negative   Negative    09/17/2019  9:40 AM Negative  Negative    03/17/2018 12:00 AM Negative   Negative    02/18/2018 12:00 AM Negative   Negative    10/22/2016 12:00 AM Negative   Negative    08/08/2016 12:00 AM **POSITIVE**   Negative    05/14/2014 10:54 PM  POSITIVE   NEGATIVE     Hepatitis B Lab Results  Component Value Date   HEPBSAB NEG 05/20/2014   HEPBSAG NEGATIVE 05/20/2014   HEPBCAB NON REACTIVE 05/20/2014   Hepatitis C No results found for: "HEPCAB", "HCVRNAPCRQN" Hepatitis A Lab Results  Component Value Date   HAV NON REACTIVE 05/20/2014   Lipids: Lab Results  Component Value Date   CHOL 172 03/09/2022   TRIG 335 (H) 03/09/2022   HDL 37 (L) 03/09/2022   CHOLHDL 4.6 03/09/2022   VLDL 67 (H) 10/22/2016   LDLCALC 90  03/09/2022    Assessment: Seth Taylor presents to clinic today for STI testing. He was sexually active with a new partner while out of town in [place] a few weeks ago and participated in insertive rectal and receptive oral sex. He woke up this morning with yellow-green penile discharge and a small bump on his penis. Will treat empirically for syphilis with Bicillin and gonorrhea with ceftriaxone. Will test for syphilis, gonorrhea, and chlamydia and treat chlamydia if needed based on results. He states he used condoms during insertive sex but did not during oral sex. Provided him with dental dams today; states he has enough condoms at this time.   Patient also missed an appointment with Dr. Luciana Axe for HIV management last week. States he has not taken [place] since April when he got out of [facility] jail. Resending Symtuza to [facility] on [address]. He has active Medicaid, and his copay is $0. Will also recheck hepatitis A and B immunity after he received vaccines for both several years ago. Due for 3rd HPV vaccine, Menveo, PCV20, and Tdap; these were not discussed during today's visit given his concerns with receiving gluteal injections. He will follow up with Dr. Luciana Axe next month.   Plan: - Refill Symtuza  - Check HIV RNA, HBV sAb, HAV sAb, RPR, and urine/oral cytologies - Administer Bicillin 2.4 million units x 1 - Administer ceftriaxone 500mg  x 1 - Follow up on STI results  - Follow up with Dr. Luciana Axe on 8/9  Margarite Gouge, PharmD, CPP, BCIDP, AAHIVP Clinical Pharmacist Practitioner Infectious Diseases Clinical Pharmacist [facility] 12/26/2022, 3:26 PM

## 2022-12-27 LAB — CYTOLOGY, (ORAL, ANAL, URETHRAL) ANCILLARY ONLY
Chlamydia: NEGATIVE
Comment: NEGATIVE
Comment: NORMAL
Neisseria Gonorrhea: NEGATIVE

## 2022-12-27 LAB — URINE CYTOLOGY ANCILLARY ONLY
Chlamydia: NEGATIVE
Comment: NEGATIVE
Comment: NORMAL
Neisseria Gonorrhea: POSITIVE — AB

## 2022-12-27 LAB — HEPATITIS B SURFACE ANTIBODY,QUALITATIVE: Hep B S Ab: NONREACTIVE

## 2022-12-27 LAB — RPR TITER: RPR Titer: 1:4 {titer} — ABNORMAL HIGH

## 2022-12-28 ENCOUNTER — Encounter: Payer: Self-pay | Admitting: Pharmacist

## 2022-12-28 LAB — T PALLIDUM AB: T Pallidum Abs: POSITIVE — AB

## 2022-12-29 LAB — RPR: RPR Ser Ql: REACTIVE — AB

## 2022-12-29 LAB — HEPATITIS A ANTIBODY, TOTAL: Hepatitis A AB,Total: REACTIVE — AB

## 2022-12-29 LAB — HIV-1 RNA QUANT-NO REFLEX-BLD
HIV 1 RNA Quant: 136000 Copies/mL — ABNORMAL HIGH
HIV-1 RNA Quant, Log: 5.13 Log cps/mL — ABNORMAL HIGH

## 2023-01-18 ENCOUNTER — Ambulatory Visit: Payer: Medicaid Other | Admitting: Internal Medicine

## 2023-01-24 ENCOUNTER — Ambulatory Visit: Admitting: Internal Medicine

## 2023-01-25 ENCOUNTER — Ambulatory Visit (INDEPENDENT_AMBULATORY_CARE_PROVIDER_SITE_OTHER): Payer: Medicaid Other | Admitting: Internal Medicine

## 2023-01-25 ENCOUNTER — Encounter: Payer: Self-pay | Admitting: Internal Medicine

## 2023-01-25 ENCOUNTER — Other Ambulatory Visit: Payer: Self-pay

## 2023-01-25 VITALS — BP 121/78 | HR 80 | Temp 98.1°F | Resp 16 | Wt 300.2 lb

## 2023-01-25 DIAGNOSIS — Z7185 Encounter for immunization safety counseling: Secondary | ICD-10-CM

## 2023-01-25 DIAGNOSIS — Z5181 Encounter for therapeutic drug level monitoring: Secondary | ICD-10-CM | POA: Diagnosis not present

## 2023-01-25 DIAGNOSIS — Z113 Encounter for screening for infections with a predominantly sexual mode of transmission: Secondary | ICD-10-CM

## 2023-01-25 DIAGNOSIS — Z7252 High risk homosexual behavior: Secondary | ICD-10-CM | POA: Diagnosis not present

## 2023-01-25 DIAGNOSIS — B20 Human immunodeficiency virus [HIV] disease: Secondary | ICD-10-CM

## 2023-01-25 DIAGNOSIS — Z7251 High risk heterosexual behavior: Secondary | ICD-10-CM | POA: Insufficient documentation

## 2023-01-25 MED ORDER — SYMTUZA 800-150-200-10 MG PO TABS
1.0000 | ORAL_TABLET | Freq: Every day | ORAL | 11 refills | Status: DC
Start: 2023-01-25 — End: 2023-05-08

## 2023-01-25 MED ORDER — DOXYCYCLINE HYCLATE 100 MG PO TABS: 200.0000 mg | ORAL_TABLET | Freq: Once | ORAL | 5 refills | Status: AC

## 2023-01-25 NOTE — Assessment & Plan Note (Signed)
Recent positive gonorrhea I discussed DoxyPEP and prescribed  Discussed wwhen to use it

## 2023-01-25 NOTE — Assessment & Plan Note (Signed)
Will check his cbc and cmp

## 2023-01-25 NOTE — Progress Notes (Signed)
   Subjective:    Patient ID: Seth Taylor, male    DOB: 1996-02-29, 27 y.o.   MRN: [id number]  HPI Seth Taylor is here for follow up of HIV He was released from jail earlier this year and now out and had been off his medications.  He was seen by pharmacy last month and underwent STI testing and treatment and restarted his Symtuza.  He has no complaints today..  has condoms.     Review of Systems  Constitutional:  Negative for fatigue.  Gastrointestinal:  Negative for diarrhea and nausea.  Skin:  Negative for rash.       Objective:   Physical Exam Eyes:     General: No scleral icterus. Pulmonary:     Effort: Pulmonary effort is normal.  Skin:    Findings: No rash.  Neurological:     Mental Status: He is alert.   SH: no current tobacco        Assessment & Plan:

## 2023-01-25 NOTE — Patient Instructions (Signed)
Take 2 tablets of doxycycline within 72 hours of an unprotected sexual encounter (ideally within the first 24 hours)

## 2023-01-25 NOTE — Assessment & Plan Note (Signed)
He is doing well by his report and will recheck his labs today to check for viral suppression.  I emphasized compliance.   Rtc in 2-3 months.

## 2023-01-25 NOTE — Assessment & Plan Note (Signed)
He is due for several vaccines and discussed the importance of vaccines in prevention.  He declined as he does not feel poorly. Counseled on benefits

## 2023-01-29 ENCOUNTER — Telehealth: Payer: Self-pay

## 2023-01-29 NOTE — Telephone Encounter (Signed)
-----   Message from Belia Heman Comer sent at 01/29/2023  3:39 PM EDT ----- Can you add on a genotype to his viral load?  thanks ----- Message ----- From: Janace Hoard Lab Results In Sent: 01/25/2023   4:46 PM EDT To: Gardiner Barefoot, MD

## 2023-01-29 NOTE — Telephone Encounter (Signed)
Call placed to [facility] and requested to add genotype to viral load.  Valarie Cones, LPN

## 2023-02-09 LAB — CBC WITH DIFFERENTIAL/PLATELET
Absolute Monocytes: 380 {cells}/uL (ref 200–950)
Basophils Absolute: 22 {cells}/uL (ref 0–200)
Basophils Relative: 0.4 %
Eosinophils Absolute: 61 {cells}/uL (ref 15–500)
Eosinophils Relative: 1.1 %
HCT: 43 % (ref 38.5–50.0)
Hemoglobin: 14.5 g/dL (ref 13.2–17.1)
Lymphs Abs: 2409 cells/uL (ref 850–3900)
MCH: 27.2 pg (ref 27.0–33.0)
MCHC: 33.7 g/dL (ref 32.0–36.0)
MCV: 80.5 fL (ref 80.0–100.0)
MPV: 10.7 fL (ref 7.5–12.5)
Monocytes Relative: 6.9 %
Neutro Abs: 2629 {cells}/uL (ref 1500–7800)
Neutrophils Relative %: 47.8 %
Platelets: 246 10*3/uL (ref 140–400)
RBC: 5.34 10*6/uL (ref 4.20–5.80)
RDW: 13 % (ref 11.0–15.0)
Total Lymphocyte: 43.8 %
WBC: 5.5 10*3/uL (ref 3.8–10.8)

## 2023-02-09 LAB — COMPLETE METABOLIC PANEL WITH GFR
AG Ratio: 0.9 (calc) — ABNORMAL LOW (ref 1.0–2.5)
ALT: 22 U/L (ref 9–46)
AST: 24 U/L (ref 10–40)
Albumin: 3.9 g/dL (ref 3.6–5.1)
Alkaline phosphatase (APISO): 69 U/L (ref 36–130)
BUN: 12 mg/dL (ref 7–25)
CO2: 29 mmol/L (ref 20–32)
Calcium: 9.3 mg/dL (ref 8.6–10.3)
Chloride: 102 mmol/L (ref 98–110)
Creat: 1.04 mg/dL (ref 0.60–1.24)
Globulin: 4.4 g/dL — ABNORMAL HIGH (ref 1.9–3.7)
Glucose, Bld: 117 mg/dL — ABNORMAL HIGH (ref 65–99)
Potassium: 3.7 mmol/L (ref 3.5–5.3)
Sodium: 138 mmol/L (ref 135–146)
Total Bilirubin: 0.4 mg/dL (ref 0.2–1.2)
Total Protein: 8.3 g/dL — ABNORMAL HIGH (ref 6.1–8.1)
eGFR: 101 mL/min/{1.73_m2} (ref >=60)

## 2023-02-09 LAB — TEST AUTHORIZATION

## 2023-02-09 LAB — HIV-1 RNA QUANT-NO REFLEX-BLD
HIV 1 RNA Quant: 23100 {copies}/mL — ABNORMAL HIGH
HIV-1 RNA Quant, Log: 4.36 {Log_copies}/mL — ABNORMAL HIGH

## 2023-02-09 LAB — T-HELPER CELLS (CD4) COUNT (NOT AT ARMC)
Absolute CD4: 571 {cells}/uL (ref 490–1740)
CD4 T Helper %: 23 % — ABNORMAL LOW (ref 30–61)
Total lymphocyte count: 2462 {cells}/uL (ref 850–3900)

## 2023-02-09 LAB — HIV-1 GENOTYPE: HIV-1 Genotype: DETECTED — AB

## 2023-03-01 ENCOUNTER — Telehealth: Payer: Self-pay

## 2023-03-01 NOTE — Telephone Encounter (Signed)
Patient called stating that he needs a TB skin test for a job. Advised patient we do not do TB skin test here however we can check for TB by lab draw. Patient stated that he would rather have the TB skin test done and asked do we get the results back in 48 hours, I informed him again that we can only test for TB by lab draw at our office. Patient stated okay and hang up the phone. Patient will need a lab appointment if he wishes to get Quantiferon gold tb test.   Marcell Anger, CMA

## 2023-05-01 ENCOUNTER — Ambulatory Visit: Payer: Medicaid Other | Admitting: Internal Medicine

## 2023-05-01 ENCOUNTER — Other Ambulatory Visit: Payer: Self-pay

## 2023-05-08 ENCOUNTER — Other Ambulatory Visit: Payer: Self-pay

## 2023-05-08 ENCOUNTER — Encounter: Payer: Self-pay | Admitting: Internal Medicine

## 2023-05-08 ENCOUNTER — Other Ambulatory Visit (HOSPITAL_COMMUNITY)
Admission: RE | Admit: 2023-05-08 | Discharge: 2023-05-08 | Disposition: A | Discharge status: Home / Self Care | Payer: Medicaid Other | Source: Ambulatory Visit | Attending: Internal Medicine | Admitting: Internal Medicine

## 2023-05-08 ENCOUNTER — Ambulatory Visit: Payer: Medicaid Other | Admitting: Internal Medicine

## 2023-05-08 VITALS — BP 137/83 | HR 71 | Temp 97.9°F | Ht 71.0 in | Wt 320.0 lb

## 2023-05-08 DIAGNOSIS — B20 Human immunodeficiency virus [HIV] disease: Secondary | ICD-10-CM

## 2023-05-08 DIAGNOSIS — Z113 Encounter for screening for infections with a predominantly sexual mode of transmission: Secondary | ICD-10-CM | POA: Insufficient documentation

## 2023-05-08 DIAGNOSIS — Z6841 Body Mass Index (BMI) 40.0 and over, adult: Secondary | ICD-10-CM | POA: Diagnosis not present

## 2023-05-08 MED ORDER — SYMTUZA 800-150-200-10 MG PO TABS
1.0000 | ORAL_TABLET | Freq: Every day | ORAL | 11 refills | Status: DC
Start: 2023-05-08 — End: 2023-09-20

## 2023-05-08 NOTE — Assessment & Plan Note (Signed)
Discussed diet and exercise 

## 2023-05-08 NOTE — Progress Notes (Signed)
   Subjective:    Patient ID: Seth Taylor, male    DOB: Oct 02, 1995, 27 y.o.   MRN: [id number]  HPI Cordis is here for a work in visit He has been on [place] but told he was unable to get refills by the pharmacy.  Fortunately had extra and has been taking it.  No new complaints otherwise.     Review of Systems  Constitutional:  Negative for fatigue.  Gastrointestinal:  Negative for diarrhea.  Skin:  Negative for rash.       Objective:   Physical Exam Eyes:     General: No scleral icterus. Pulmonary:     Effort: Pulmonary effort is normal.  Neurological:     Mental Status: He is alert.   SH: no tobacco        Assessment & Plan:

## 2023-05-08 NOTE — Assessment & Plan Note (Signed)
Will screen 

## 2023-05-08 NOTE — Assessment & Plan Note (Signed)
He is on medication and will check his labs today.   Refills provided though he had some regardless.  Will go pick up today Follow up in 3 months.

## 2023-05-10 LAB — CYTOLOGY, (ORAL, ANAL, URETHRAL) ANCILLARY ONLY
Chlamydia: POSITIVE — AB
Comment: NEGATIVE
Comment: NORMAL
Neisseria Gonorrhea: NEGATIVE

## 2023-05-10 LAB — URINE CYTOLOGY ANCILLARY ONLY
Chlamydia: NEGATIVE
Comment: NEGATIVE
Comment: NORMAL
Neisseria Gonorrhea: NEGATIVE

## 2023-05-10 LAB — T-HELPER CELLS (CD4) COUNT (NOT AT ARMC)
Absolute CD4: 452 {cells}/uL — ABNORMAL LOW (ref 490–1740)
CD4 T Helper %: 19 % — ABNORMAL LOW (ref 30–61)
Total lymphocyte count: 2330 {cells}/uL (ref 850–3900)

## 2023-05-10 LAB — HIV-1 RNA QUANT-NO REFLEX-BLD
HIV 1 RNA Quant: 2270 {copies}/mL — ABNORMAL HIGH
HIV-1 RNA Quant, Log: 3.36 {Log_copies}/mL — ABNORMAL HIGH

## 2023-05-13 ENCOUNTER — Other Ambulatory Visit: Payer: Self-pay | Admitting: Internal Medicine

## 2023-05-13 ENCOUNTER — Telehealth: Payer: Self-pay

## 2023-05-13 MED ORDER — AZITHROMYCIN 500 MG PO TABS: 1000.0000 mg | ORAL_TABLET | Freq: Once | ORAL | 0 refills | Status: AC

## 2023-05-13 NOTE — Telephone Encounter (Signed)
Patient aware of abx sent to pharmacy. Patient aware to abstain from sexual intercourse now and 7 days after tx. Also to notify sexual partners that they need to be tested and treated as well.    Lizett Chowning Lesli Albee, CMA

## 2023-05-13 NOTE — Telephone Encounter (Signed)
Patient called regarding anal swab results. Advised I would send message to provider and call him back. Patient uses [facility] on [address].    Seth Taylor Lesli Albee, CMA

## 2023-06-19 ENCOUNTER — Telehealth: Payer: Self-pay

## 2023-06-19 NOTE — Telephone Encounter (Signed)
 Patient called office today regarding bump near ear piercing. States that bump has grown since Friday when he first noticed it. Is requesting antibiotics for this. Does not have a PCP who he can see for this.  Would like prescription sent to [facility] on [address].  Requested patient establish care with PCP.  Lorenda CHRISTELLA Code, RMA

## 2023-06-19 NOTE — Telephone Encounter (Signed)
 Spoke with pt and requested he go to Urgent Care.  Verbalized understanding. Juanita Laster, RMA

## 2023-06-19 NOTE — Progress Notes (Signed)
 Subjective Patient ID: Seth Taylor is a 28 y.o. male.  Seth Taylor is a 28 y.o. male with no significant medical history to [facility] for worsening swelling and redness behind left ear x 2-3 days. States that he is concerned for possible cyst formation. Currently rates pain 6/10 and describes as throbbing/ aching, increasing with palpation. Denies any drainage from site.      Review of Systems  Constitutional:  Negative for activity change, chills, diaphoresis, fatigue and fever.  Respiratory:  Negative for shortness of breath.   Cardiovascular:  Negative for chest pain.  Musculoskeletal:  Negative for neck pain.  Skin:  Positive for color change.  Neurological:  Negative for dizziness, weakness, numbness and headaches.    Patient History  Allergies: No Known Allergies   History reviewed. No pertinent past medical history. History reviewed. No pertinent surgical history. Social History   Socioeconomic History  . Marital status: Single    Spouse name: Not on file  . Number of children: Not on file  . Years of education: Not on file  . Highest education level: Not on file  Occupational History  . Not on file  Tobacco Use  . Smoking status: Never  . Smokeless tobacco: Never  Substance and Sexual Activity  . Alcohol use: Yes  . Drug use: Not on file  . Sexual activity: Not on file  Other Topics Concern  . Not on file  Social History Narrative  . Not on file   History reviewed. No pertinent family history. No current outpatient medications on file prior to visit.   No current facility-administered medications on file prior to visit.     Objective  Vitals:   06/19/23 1645  BP: 128/85  Pulse: 76  Resp: 18  Temp: 37.1 C (98.8 F)  TempSrc: Oral  SpO2: 99%  Weight: (!) 150 kg  Height: 5' 11                No results found.  Physical Exam Vitals and nursing note reviewed.  Constitutional:      Appearance: Normal appearance. He is normal  weight.  HENT:     Head: Normocephalic and atraumatic.      Comments: 1 cm abscess noted with surrounding erythema, mild tenderness with palpation Cardiovascular:     Rate and Rhythm: Normal rate and regular rhythm.  Pulmonary:     Effort: Pulmonary effort is normal. No respiratory distress.     Breath sounds: Normal breath sounds.  Skin:    General: Skin is warm and dry.     Capillary Refill: Capillary refill takes less than 2 seconds.  Neurological:     General: No focal deficit present.     Mental Status: He is alert and oriented to person, place, and time.  Psychiatric:        Mood and Affect: Mood normal.        Behavior: Behavior normal.      No results found for this visit on 06/19/23.   Incision and Drainage  Date/Time: 06/19/2023 4:15 PM  Performed by: Christain Asp, NP Authorized by: Christain Asp, NP   Consent:    Consent obtained:  Verbal   Consent given by:  Patient   Risks, benefits, and alternatives were discussed: yes     Risks discussed:  Bleeding and incomplete drainage   Alternatives discussed:  No treatment and delayed treatment Universal protocol:    Procedure explained and questions answered to patient or proxy's satisfaction: yes  Patient identity confirmed:  Verbally with patient Location:    Type:  Abscess   Size:  1 cm   Location:  Head   Head location:  L external ear Pre-procedure details:    Skin preparation:  Chlorhexidine with alcohol Sedation:    Sedation type:  None Anesthesia:    Anesthesia method:  Local infiltration   Local anesthetic:  Lidocaine  2% w/o epi Procedure type:    Complexity:  Simple Procedure details:    Incision types:  Stab incision   Drainage:  Bloody and purulent   Drainage amount:  Moderate   Wound treatment:  Wound left open   Packing materials:  None Post-procedure details:    Procedure completion:  Tolerated well, no immediate complications  MDM:     1 Acute, uncomplicated illness or injury      Assessment requiring historian other than patient: No     Independent visualization of image, tracing, or test: No     Discussion of management with another provider: No     Risk:: Moderate          Assessment/Plan Diagnoses and all orders for this visit:  Abscess of left external ear -     sulfamethoxazole -trimethoprim  (Bactrim  DS) 800-160 MG tablet; Take 1 tablet by mouth in the morning and 1 tablet in the evening. Do all this for 5 days. -     Incision and Drainage  Cellulitis of other specified site -     sulfamethoxazole -trimethoprim  (Bactrim  DS) 800-160 MG tablet; Take 1 tablet by mouth in the morning and 1 tablet in the evening. Do all this for 5 days. -     Incision and Drainage      Disposition Status: Home  Progress note signed by Christain Asp, NP on 06/19/23 at  7:04 PM

## 2023-08-14 ENCOUNTER — Ambulatory Visit: Payer: Medicaid Other | Admitting: Internal Medicine

## 2023-08-20 ENCOUNTER — Ambulatory Visit: Admitting: Internal Medicine

## 2023-08-23 ENCOUNTER — Ambulatory Visit: Admitting: Internal Medicine

## 2023-09-20 ENCOUNTER — Ambulatory Visit: Admitting: Internal Medicine

## 2023-09-20 ENCOUNTER — Ambulatory Visit (INDEPENDENT_AMBULATORY_CARE_PROVIDER_SITE_OTHER): Admitting: Pharmacist

## 2023-09-20 ENCOUNTER — Other Ambulatory Visit (HOSPITAL_COMMUNITY): Payer: Self-pay

## 2023-09-20 ENCOUNTER — Other Ambulatory Visit: Payer: Self-pay

## 2023-09-20 DIAGNOSIS — Z113 Encounter for screening for infections with a predominantly sexual mode of transmission: Secondary | ICD-10-CM | POA: Diagnosis not present

## 2023-09-20 DIAGNOSIS — B20 Human immunodeficiency virus [HIV] disease: Secondary | ICD-10-CM | POA: Diagnosis present

## 2023-09-20 MED ORDER — BIKTARVY 50-200-25 MG PO TABS: ORAL_TABLET | ORAL | 2 refills | Status: DC

## 2023-09-20 NOTE — Progress Notes (Signed)
 HPI: Seth Taylor is a 28 y.o. male who presents to the [facility] pharmacy clinic for HIV follow-up.  Patient Active Problem List   Diagnosis Date Noted   High risk sexual behavior 01/25/2023   Medication monitoring encounter 01/25/2023   Vaccine counseling 01/25/2023   Routine screening for STI (sexually transmitted infection) 03/09/2022   Chlamydia 01/12/2021   Low back pain 12/19/2020   Marijuana use 12/19/2020   HTN (hypertension) 05/10/2020   Rectal lesion 05/10/2020   Morbid obesity (HCC) 03/31/2018   HIV disease (HCC) 07/08/2014   Syphilis 07/08/2014   Gonorrhea 07/08/2014   Hypertriglyceridemia 07/08/2014    Patient's Medications  New Prescriptions   No medications on file  Previous Medications   DARUNAVIR-COBICISTAT-EMTRICITABINE-TENOFOVIR ALAFENAMIDE (SYMTUZA) 800-150-200-10 MG TABS    Take 1 tablet by mouth daily with breakfast.  Modified Medications   No medications on file  Discontinued Medications   No medications on file    Labs: Lab Results  Component Value Date   HIV1RNAQUANT 2,270 (H) 05/08/2023   HIV1RNAQUANT 23,100 (H) 01/25/2023   HIV1RNAQUANT 136,000 (H) 12/26/2022   CD4TABS 456 04/27/2021   CD4TABS 491 03/01/2021   CD4TABS 366 (L) 01/12/2021    RPR and STI Lab Results  Component Value Date   LABRPR REACTIVE (A) 12/26/2022   LABRPR REACTIVE (A) 08/29/2022   LABRPR REACTIVE (A) 03/09/2022   LABRPR REACTIVE (A) 03/01/2021   LABRPR REACTIVE (A) 01/12/2021   RPRTITER 1:4 (H) 12/26/2022   RPRTITER 1:8 (H) 08/29/2022   RPRTITER 1:16 (H) 03/09/2022   RPRTITER 1:16 (H) 03/01/2021   RPRTITER 1:16 (H) 01/12/2021    STI Results GC GC CT CT  Latest Ref Rng & Units  NEGATIVE  NEGATIVE  05/08/2023  9:31 AM Negative    Negative   Negative    Positive    12/26/2022  2:57 PM Negative    Positive   Negative    Negative    03/01/2021  2:29 PM Negative   Negative    01/12/2021  3:38 PM Negative   Negative    05/25/2020  2:59 PM Positive    Positive    05/25/2020  2:58 PM Negative   Negative    09/17/2019  9:40 AM Negative   Negative    03/17/2018 12:00 AM Negative   Negative    02/18/2018 12:00 AM Negative   Negative    10/22/2016 12:00 AM Negative   Negative    08/08/2016 12:00 AM **POSITIVE**   Negative    05/14/2014 10:54 PM  POSITIVE   NEGATIVE     Hepatitis B Lab Results  Component Value Date   HEPBSAB NON-REACTIVE 12/26/2022   HEPBSAG NEGATIVE 05/20/2014   HEPBCAB NON REACTIVE 05/20/2014   Hepatitis C No results found for: "HEPCAB", "HCVRNAPCRQN" Hepatitis A Lab Results  Component Value Date   HAV REACTIVE (A) 12/26/2022   Lipids: Lab Results  Component Value Date   CHOL 172 03/09/2022   TRIG 335 (H) 03/09/2022   HDL 37 (L) 03/09/2022   CHOLHDL 4.6 03/09/2022   VLDL 67 (H) 10/22/2016   LDLCALC 90 03/09/2022    Current HIV Regimen: Symtuza - last fill January 2025; before that - November x30d, August x30d Fills at [facility]  Assessment: [name] presents today for HIV follow-up on Symtuza, last seen by Dr Luciana Axe 05/08/2023. HIV not controlled with RNA 2270 in November 2024, CD4 571. Has not been undetectable since 2017. Admits to taking half a pill if he is running low and forgetting  to pick up from the pharmacy. Explained importance of taking one whole pill every day. Discussed 90-day prescriptions instead of 30-day to minimize number of times to pick-up. Medicaid appears expired, will have him meet with Deanna today. Provided samples of Biktarvy (no samples of Symtuza available). Patient agrees with switching to [facility], encouraged by smaller pill size.   Patient concerned about condom allergy, expresses burning when urinating after sex. Will provide latex free condoms, STI testing.   He is due for hepatitis B vaccination with negative surface antibody despite vaccination in 2017. Also eligible for PCV20, HPV dose 3, Shingles, Menveo, and Tetanus. Declines vaccinations today.   Last CMP, CBC from  01/2023. Due for updated lipid panel.  Plan: - HIV RNA, lipid panel today - Provided samples of Biktarvy, will send prescription to [facility] on [address] once Medicaid approved - RPR, gonorrhea/chlamydia urine/oral/rectal cytologies today - Scheduled for follow-up with Dr Daiva Eves new provider on July 16th  Haze Boyden PharmD Candidate

## 2023-09-21 LAB — CT/NG RNA, TMA RECTAL
Chlamydia Trachomatis RNA: NOT DETECTED
Neisseria Gonorrhoeae RNA: NOT DETECTED

## 2023-09-21 LAB — GC/CHLAMYDIA PROBE, AMP (THROAT)
Chlamydia trachomatis RNA: NOT DETECTED
Neisseria gonorrhoeae RNA: NOT DETECTED

## 2023-09-21 LAB — C. TRACHOMATIS/N. GONORRHOEAE RNA
C. trachomatis RNA, TMA: NOT DETECTED
N. gonorrhoeae RNA, TMA: NOT DETECTED

## 2023-09-24 ENCOUNTER — Other Ambulatory Visit (HOSPITAL_COMMUNITY): Payer: Self-pay

## 2023-09-24 LAB — LIPID PANEL
Cholesterol: 273 mg/dL — ABNORMAL HIGH (ref <200)
HDL: 52 mg/dL (ref >=40)
Non-HDL Cholesterol (Calc): 221 mg/dL — ABNORMAL HIGH (ref <130)
Total CHOL/HDL Ratio: 5.3 (calc) — ABNORMAL HIGH (ref <5.0)
Triglycerides: 461 mg/dL — ABNORMAL HIGH (ref <150)

## 2023-09-24 LAB — HIV-1 RNA QUANT-NO REFLEX-BLD
HIV 1 RNA Quant: 297000 {copies}/mL — ABNORMAL HIGH
HIV-1 RNA Quant, Log: 5.47 {Log_copies}/mL — ABNORMAL HIGH

## 2023-09-24 LAB — T PALLIDUM AB: T Pallidum Abs: POSITIVE — AB

## 2023-09-24 LAB — RPR: RPR Ser Ql: REACTIVE — AB

## 2023-09-24 LAB — RPR TITER: RPR Titer: 1:16 {titer} — ABNORMAL HIGH

## 2023-09-25 ENCOUNTER — Other Ambulatory Visit: Payer: Self-pay | Admitting: Pharmacist

## 2023-09-25 ENCOUNTER — Telehealth: Payer: Self-pay

## 2023-09-25 DIAGNOSIS — B20 Human immunodeficiency virus [HIV] disease: Secondary | ICD-10-CM

## 2023-09-25 MED ORDER — BICTEGRAVIR-EMTRICITAB-TENOFOV 50-200-25 MG PO TABS: 1.0000 | ORAL_TABLET | Freq: Every day | ORAL | Status: AC

## 2023-09-25 NOTE — Progress Notes (Signed)
 Medication Samples have been provided to the patient.  Drug name: Biktarvy        Strength: 50/200/25 mg       Qty: 14 tablets (2 bottles) LOT: [id number]   Exp.Date: 7/27  Samples requested by Nicklas Barns, PharmD as patient is pending Medicaid.  Dosing instructions: Take one tablet by mouth once daily  The patient has been instructed regarding the correct time, dose, and frequency of taking this medication, including desired effects and most common side effects.   Nicklas Barns, PharmD, CPP, BCIDP, AAHIVP Clinical Pharmacist Practitioner Infectious Diseases Clinical Pharmacist [facility] for [facility]

## 2023-09-25 NOTE — Progress Notes (Unsigned)
 HPI: Seth Taylor is a 28 y.o. male who presents to the [facility] pharmacy clinic for syphilis treatment.  Patient Active Problem List   Diagnosis Date Noted   High risk sexual behavior 01/25/2023   Medication monitoring encounter 01/25/2023   Vaccine counseling 01/25/2023   Routine screening for STI (sexually transmitted infection) 03/09/2022   Chlamydia 01/12/2021   Low back pain 12/19/2020   Marijuana use 12/19/2020   HTN (hypertension) 05/10/2020   Rectal lesion 05/10/2020   Morbid obesity (HCC) 03/31/2018   HIV disease (HCC) 07/08/2014   Syphilis 07/08/2014   Gonorrhea 07/08/2014   Hypertriglyceridemia 07/08/2014    Patient's Medications  New Prescriptions   No medications on file  Previous Medications   BICTEGRAVIR-EMTRICITABINE-TENOFOVIR AF (BIKTARVY) 50-200-25 MG TABS TABLET    Take 1 whole tablet daily.   BICTEGRAVIR-EMTRICITABINE-TENOFOVIR AF (BIKTARVY) 50-200-25 MG TABS TABLET    Take 1 tablet by mouth daily for 14 days.  Modified Medications   No medications on file  Discontinued Medications   No medications on file    Labs: Lab Results  Component Value Date   HIV1RNAQUANT 297,000 (H) 09/20/2023   HIV1RNAQUANT 2,270 (H) 05/08/2023   HIV1RNAQUANT 23,100 (H) 01/25/2023   CD4TABS 456 04/27/2021   CD4TABS 491 03/01/2021   CD4TABS 366 (L) 01/12/2021    RPR and STI Lab Results  Component Value Date   LABRPR REACTIVE (A) 09/20/2023   LABRPR REACTIVE (A) 12/26/2022   LABRPR REACTIVE (A) 08/29/2022   LABRPR REACTIVE (A) 03/09/2022   LABRPR REACTIVE (A) 03/01/2021   RPRTITER 1:16 (H) 09/20/2023   RPRTITER 1:4 (H) 12/26/2022   RPRTITER 1:8 (H) 08/29/2022   RPRTITER 1:16 (H) 03/09/2022   RPRTITER 1:16 (H) 03/01/2021    STI Results GC GC CT CT  Latest Ref Rng & Units  NEGATIVE  NEGATIVE  05/08/2023  9:31 AM Negative    Negative   Negative    Positive    12/26/2022  2:57 PM Negative    Positive   Negative    Negative    03/01/2021  2:29 PM Negative    Negative    01/12/2021  3:38 PM Negative   Negative    05/25/2020  2:59 PM Positive   Positive    05/25/2020  2:58 PM Negative   Negative    09/17/2019  9:40 AM Negative   Negative    03/17/2018 12:00 AM Negative   Negative    02/18/2018 12:00 AM Negative   Negative    10/22/2016 12:00 AM Negative   Negative    08/08/2016 12:00 AM **POSITIVE**   Negative    05/14/2014 10:54 PM  POSITIVE   NEGATIVE     Hepatitis B Lab Results  Component Value Date   HEPBSAB NON-REACTIVE 12/26/2022   HEPBSAG NEGATIVE 05/20/2014   HEPBCAB NON REACTIVE 05/20/2014   Hepatitis C No results found for: "HEPCAB", "HCVRNAPCRQN" Hepatitis A Lab Results  Component Value Date   HAV REACTIVE (A) 12/26/2022   Lipids: Lab Results  Component Value Date   CHOL 273 (H) 09/20/2023   TRIG 461 (H) 09/20/2023   HDL 52 09/20/2023   CHOLHDL 5.3 (H) 09/20/2023   VLDL 67 (H) 10/22/2016   LDLCALC  09/20/2023     Comment:     . LDL cholesterol not calculated. Triglyceride levels greater than 400 mg/dL invalidate calculated LDL results. . Reference range: <100 . Desirable range <100 mg/dL for primary prevention;   <70 mg/dL for patients with CHD or diabetic patients  with > or = 2 CHD risk factors. Aaron Aas LDL-C is now calculated using the Martin-Hopkins  calculation, which is a validated novel method providing  better accuracy than the Friedewald equation in the  estimation of LDL-C.  Melinda Sprawls et al. Erroll Heard. 3244;010(27): 2061-2068  (http://education.[employer].com/faq/FAQ164)     Assessment: [name] presents today for syphilis treatment. He tested positive on 09/20/2023 with a titer of 1:16. Last documented RPR was 1:4 on 12/26/2023. Hameed will need Bicillin 2.4 million units x 1. No known allergies to any antibiotics. Advised to take ibuprofen or acetaminophen today if needed for injection site pain.  Administered Bicillin LA 1.2 million units in each upper outer quadrant of the left and right gluteal  muscle, totaling a dose of 2.4 million units. Tolerated well. Advised to abstain from sexual activity for 10 days and to ask partners to get tested and treated. Advised that the health department should be reaching out to contact trace. Answered all questions. [name] will call with any issues.  Plan: - Administered Bicillin LA 2.4 million units for syphilis diagnosis - Call with any issues or questions

## 2023-09-25 NOTE — Telephone Encounter (Signed)
 Detectable Viral Load Intervention (DVL)  Most recent VL:  HIV 1 RNA Quant  Date Value Ref Range Status  09/20/2023 297,000 (H) NOT DETECTED copies/mL Final  05/08/2023 2,270 (H) Copies/mL Final  01/25/2023 23,100 (H) Copies/mL Final    Last Clinic Visit: 09/20/23  Current ART regimen: Biktarvy  Appointment status: patient has future appointment scheduled  Medication last dispensed (per chart review):  Has not filled Biktarvy.  Medication Adherence   What pharmacy do you use for your ART?   Do you pick up your medication at the pharmacy or is it mailed to you?   How often do you miss a dose your ART?   Are you experiencing any side effects with your ART?   Are you having any trouble remembering what medication(s) you are supposed to take or how you are supposed to take them?   What helps you remember to take your medication(s)?    Barriers to Care   Lack of transportation to medical appointments?   2. Housing instability?   3. If you are currently employed, are you having difficulty taking time off of work for medical appointments?   4. Financial concerns (rent, utilities, etc.)   5. Lack of consistent access to food?   6. Trouble remembering and attending your appointments?   7. Are you experiencing any other barriers that make it hard for you to come to appointments or take medication regularly?    Interventions   Called patient to discuss medication adherence and possible barriers to care. Not able to assess. Left vm. Will send mychart message. Julien Odor, RMA

## 2023-09-26 ENCOUNTER — Ambulatory Visit: Admitting: Pharmacist

## 2023-09-26 ENCOUNTER — Other Ambulatory Visit: Payer: Self-pay

## 2023-09-26 NOTE — Progress Notes (Unsigned)
 HPI: Seth Taylor is a 28 y.o. male who presents to the [facility] pharmacy clinic for syphilis treatment.  Patient Active Problem List   Diagnosis Date Noted   High risk sexual behavior 01/25/2023   Medication monitoring encounter 01/25/2023   Vaccine counseling 01/25/2023   Routine screening for STI (sexually transmitted infection) 03/09/2022   Chlamydia 01/12/2021   Low back pain 12/19/2020   Marijuana use 12/19/2020   HTN (hypertension) 05/10/2020   Rectal lesion 05/10/2020   Morbid obesity (HCC) 03/31/2018   HIV disease (HCC) 07/08/2014   Syphilis 07/08/2014   Gonorrhea 07/08/2014   Hypertriglyceridemia 07/08/2014    Patient's Medications  New Prescriptions   No medications on file  Previous Medications   BICTEGRAVIR-EMTRICITABINE-TENOFOVIR AF (BIKTARVY) 50-200-25 MG TABS TABLET    Take 1 whole tablet daily.   BICTEGRAVIR-EMTRICITABINE-TENOFOVIR AF (BIKTARVY) 50-200-25 MG TABS TABLET    Take 1 tablet by mouth daily for 14 days.  Modified Medications   No medications on file  Discontinued Medications   No medications on file    Labs: Lab Results  Component Value Date   HIV1RNAQUANT 297,000 (H) 09/20/2023   HIV1RNAQUANT 2,270 (H) 05/08/2023   HIV1RNAQUANT 23,100 (H) 01/25/2023   CD4TABS 456 04/27/2021   CD4TABS 491 03/01/2021   CD4TABS 366 (L) 01/12/2021    RPR and STI Lab Results  Component Value Date   LABRPR REACTIVE (A) 09/20/2023   LABRPR REACTIVE (A) 12/26/2022   LABRPR REACTIVE (A) 08/29/2022   LABRPR REACTIVE (A) 03/09/2022   LABRPR REACTIVE (A) 03/01/2021   RPRTITER 1:16 (H) 09/20/2023   RPRTITER 1:4 (H) 12/26/2022   RPRTITER 1:8 (H) 08/29/2022   RPRTITER 1:16 (H) 03/09/2022   RPRTITER 1:16 (H) 03/01/2021    STI Results GC GC CT CT  Latest Ref Rng & Units  NEGATIVE  NEGATIVE  05/08/2023  9:31 AM Negative    Negative   Negative    Positive    12/26/2022  2:57 PM Negative    Positive   Negative    Negative    03/01/2021  2:29 PM Negative    Negative    01/12/2021  3:38 PM Negative   Negative    05/25/2020  2:59 PM Positive   Positive    05/25/2020  2:58 PM Negative   Negative    09/17/2019  9:40 AM Negative   Negative    03/17/2018 12:00 AM Negative   Negative    02/18/2018 12:00 AM Negative   Negative    10/22/2016 12:00 AM Negative   Negative    08/08/2016 12:00 AM **POSITIVE**   Negative    05/14/2014 10:54 PM  POSITIVE   NEGATIVE     Hepatitis B Lab Results  Component Value Date   HEPBSAB NON-REACTIVE 12/26/2022   HEPBSAG NEGATIVE 05/20/2014   HEPBCAB NON REACTIVE 05/20/2014   Hepatitis C No results found for: "HEPCAB", "HCVRNAPCRQN" Hepatitis A Lab Results  Component Value Date   HAV REACTIVE (A) 12/26/2022   Lipids: Lab Results  Component Value Date   CHOL 273 (H) 09/20/2023   TRIG 461 (H) 09/20/2023   HDL 52 09/20/2023   CHOLHDL 5.3 (H) 09/20/2023   VLDL 67 (H) 10/22/2016   LDLCALC  09/20/2023     Comment:     . LDL cholesterol not calculated. Triglyceride levels greater than 400 mg/dL invalidate calculated LDL results. . Reference range: <100 . Desirable range <100 mg/dL for primary prevention;   <70 mg/dL for patients with CHD or diabetic patients  with > or = 2 CHD risk factors. Aaron Aas LDL-C is now calculated using the Martin-Hopkins  calculation, which is a validated novel method providing  better accuracy than the Friedewald equation in the  estimation of LDL-C.  Melinda Sprawls et al. Erroll Heard. 1610;960(45): 2061-2068  (http://education.[facility].com/faq/FAQ164)     Assessment: Seth Taylor presents today for syphilis treatment. He has a history of syphilis dating back to 2015. Recently titers increased from 1:4 to 1:16 on last check. Will treat for acute syphilis infection with 4 fold titer increase. No known allergies to any antibiotics. Advised to take ibuprofen or acetaminophen today if needed for injection site pain.  Administered Bicillin LA 1.2 million units in each upper outer quadrant  of the left and right gluteal muscle, totaling a dose of 2.4 million units. Tolerated well. Advised to abstain from sexual activity for 10 days and to ask partners to get tested and treated. Advised that the health department should be reaching out to contact trace. Answered all questions. Seth Taylor will call with any issues.  Plan: - Administered Bicillin LA 2.4 million units for syphilis diagnosis - Call with any issues or questions

## 2023-09-29 NOTE — Progress Notes (Unsigned)
 HPI: Seth Taylor is a 28 y.o. male who presents to the [facility] pharmacy clinic for syphilis treatment.  Patient Active Problem List   Diagnosis Date Noted   High risk sexual behavior 01/25/2023   Medication monitoring encounter 01/25/2023   Vaccine counseling 01/25/2023   Routine screening for STI (sexually transmitted infection) 03/09/2022   Chlamydia 01/12/2021   Low back pain 12/19/2020   Marijuana use 12/19/2020   HTN (hypertension) 05/10/2020   Rectal lesion 05/10/2020   Morbid obesity (HCC) 03/31/2018   HIV disease (HCC) 07/08/2014   Syphilis 07/08/2014   Gonorrhea 07/08/2014   Hypertriglyceridemia 07/08/2014    Patient's Medications  New Prescriptions   No medications on file  Previous Medications   BICTEGRAVIR-EMTRICITABINE-TENOFOVIR AF (BIKTARVY ) 50-200-25 MG TABS TABLET    Take 1 whole tablet daily.   BICTEGRAVIR-EMTRICITABINE-TENOFOVIR AF (BIKTARVY ) 50-200-25 MG TABS TABLET    Take 1 tablet by mouth daily for 14 days.  Modified Medications   No medications on file  Discontinued Medications   No medications on file    Labs: Lab Results  Component Value Date   HIV1RNAQUANT 297,000 (H) 09/20/2023   HIV1RNAQUANT 2,270 (H) 05/08/2023   HIV1RNAQUANT 23,100 (H) 01/25/2023   CD4TABS 456 04/27/2021   CD4TABS 491 03/01/2021   CD4TABS 366 (L) 01/12/2021    RPR and STI Lab Results  Component Value Date   LABRPR REACTIVE (A) 09/20/2023   LABRPR REACTIVE (A) 12/26/2022   LABRPR REACTIVE (A) 08/29/2022   LABRPR REACTIVE (A) 03/09/2022   LABRPR REACTIVE (A) 03/01/2021   RPRTITER 1:16 (H) 09/20/2023   RPRTITER 1:4 (H) 12/26/2022   RPRTITER 1:8 (H) 08/29/2022   RPRTITER 1:16 (H) 03/09/2022   RPRTITER 1:16 (H) 03/01/2021    STI Results GC GC CT CT  Latest Ref Rng & Units  NEGATIVE  NEGATIVE  05/08/2023  9:31 AM Negative    Negative   Negative    Positive    12/26/2022  2:57 PM Negative    Positive   Negative    Negative    03/01/2021  2:29 PM Negative    Negative    01/12/2021  3:38 PM Negative   Negative    05/25/2020  2:59 PM Positive   Positive    05/25/2020  2:58 PM Negative   Negative    09/17/2019  9:40 AM Negative   Negative    03/17/2018 12:00 AM Negative   Negative    02/18/2018 12:00 AM Negative   Negative    10/22/2016 12:00 AM Negative   Negative    08/08/2016 12:00 AM **POSITIVE**   Negative    05/14/2014 10:54 PM  POSITIVE   NEGATIVE     Hepatitis B Lab Results  Component Value Date   HEPBSAB NON-REACTIVE 12/26/2022   HEPBSAG NEGATIVE 05/20/2014   HEPBCAB NON REACTIVE 05/20/2014   Hepatitis C No results found for: "HEPCAB", "HCVRNAPCRQN" Hepatitis A Lab Results  Component Value Date   HAV REACTIVE (A) 12/26/2022   Lipids: Lab Results  Component Value Date   CHOL 273 (H) 09/20/2023   TRIG 461 (H) 09/20/2023   HDL 52 09/20/2023   CHOLHDL 5.3 (H) 09/20/2023   VLDL 67 (H) 10/22/2016   LDLCALC  09/20/2023     Comment:     . LDL cholesterol not calculated. Triglyceride levels greater than 400 mg/dL invalidate calculated LDL results. . Reference range: <100 . Desirable range <100 mg/dL for primary prevention;   <70 mg/dL for patients with CHD or diabetic patients  with > or = 2 CHD risk factors. Seth Taylor LDL-C is now calculated using the Martin-Hopkins  calculation, which is a validated novel method providing  better accuracy than the Friedewald equation in the  estimation of LDL-C.  Seth Taylor et al. Seth Taylor. 8119;147(82): 2061-2068  (http://education.[facility].com/faq/FAQ164)     Assessment: Seth Taylor presents today for syphilis treatment. He tested positive on 09/20/2023 with a titer of 1:16. Last documented RPR was 1:4 on 12/26/2022. Seth Taylor will need Bicillin  2.4 million units x 1. No known allergies to any antibiotics. Advised to take ibuprofen  or acetaminophen  today if needed for injection site pain.  Administered Bicillin  LA 1.2 million units in each upper outer quadrant of the left and right gluteal  muscle, totaling a dose of 2.4 million units. *** tolerated well. Advised to abstain from sexual activity for 10 days and to ask partners to get tested and treated. Advised that the health department should be reaching out to contact trace. Answered all questions. He will call with any issues.  At last visit on 09/20/2023, HIV RNA increased to 297,000 from 2,270 in November 2024. Admitted to taking half tablets of Symtuza  when he was running low and couldn't get his refills. Will send for genotype to assess resistance. At that visit, Seth Taylor was switched from Symtuza  to Seth Taylor so that samples could be provided given his Medicaid is currently expired, awaiting renewal approval. *** concern for resistance, will check genotype.   Eligible for hepatitis B vaccination (negative surface antibody despite vaccination in 2017). Also eligible for PCV20, HPV 3/3, shingles, menveo, tetanus vaccines. *** vaccinations today.   Plan: - Administered Bicillin  LA 2.4 million units for syphilis diagnosis - *** biktarvy  samples? - *** medicaid approved? - HIV RNA with reflex to genotype today - Call with any issues or questions - Scheduled for HIV follow-up with Dr Ernie Heal on 12/25/2023  Seth Taylor PharmD Candidate

## 2023-09-30 ENCOUNTER — Ambulatory Visit: Admitting: Pharmacist

## 2023-09-30 DIAGNOSIS — B20 Human immunodeficiency virus [HIV] disease: Secondary | ICD-10-CM

## 2023-10-02 ENCOUNTER — Ambulatory Visit (INDEPENDENT_AMBULATORY_CARE_PROVIDER_SITE_OTHER): Admitting: Pharmacist

## 2023-10-02 ENCOUNTER — Encounter

## 2023-10-02 ENCOUNTER — Other Ambulatory Visit (HOSPITAL_COMMUNITY): Payer: Self-pay

## 2023-10-02 ENCOUNTER — Other Ambulatory Visit: Payer: Self-pay

## 2023-10-02 DIAGNOSIS — A539 Syphilis, unspecified: Secondary | ICD-10-CM | POA: Diagnosis present

## 2023-10-02 DIAGNOSIS — B20 Human immunodeficiency virus [HIV] disease: Secondary | ICD-10-CM

## 2023-10-02 MED ORDER — PENICILLIN G BENZATHINE 1200000 UNIT/2ML IM SUSY
1.2000 10*6.[IU] | PREFILLED_SYRINGE | Freq: Once | INTRAMUSCULAR | Status: AC
  Administered 2023-10-02: 1.2 10*6.[IU] via INTRAMUSCULAR

## 2023-10-02 NOTE — Progress Notes (Signed)
 HPI: Christofer Shen is a 28 y.o. male who presents to the [facility] pharmacy clinic for syphilis treatment.  Patient Active Problem List   Diagnosis Date Noted   High risk sexual behavior 01/25/2023   Medication monitoring encounter 01/25/2023   Vaccine counseling 01/25/2023   Routine screening for STI (sexually transmitted infection) 03/09/2022   Chlamydia 01/12/2021   Low back pain 12/19/2020   Marijuana use 12/19/2020   HTN (hypertension) 05/10/2020   Rectal lesion 05/10/2020   Morbid obesity (HCC) 03/31/2018   HIV disease (HCC) 07/08/2014   Syphilis 07/08/2014   Gonorrhea 07/08/2014   Hypertriglyceridemia 07/08/2014    Patient's Medications  New Prescriptions   No medications on file  Previous Medications   BICTEGRAVIR-EMTRICITABINE-TENOFOVIR AF (BIKTARVY ) 50-200-25 MG TABS TABLET    Take 1 whole tablet daily.   BICTEGRAVIR-EMTRICITABINE-TENOFOVIR AF (BIKTARVY ) 50-200-25 MG TABS TABLET    Take 1 tablet by mouth daily for 14 days.  Modified Medications   No medications on file  Discontinued Medications   No medications on file    Labs: Lab Results  Component Value Date   HIV1RNAQUANT 297,000 (H) 09/20/2023   HIV1RNAQUANT 2,270 (H) 05/08/2023   HIV1RNAQUANT 23,100 (H) 01/25/2023   CD4TABS 456 04/27/2021   CD4TABS 491 03/01/2021   CD4TABS 366 (L) 01/12/2021    RPR and STI Lab Results  Component Value Date   LABRPR REACTIVE (A) 09/20/2023   LABRPR REACTIVE (A) 12/26/2022   LABRPR REACTIVE (A) 08/29/2022   LABRPR REACTIVE (A) 03/09/2022   LABRPR REACTIVE (A) 03/01/2021   RPRTITER 1:16 (H) 09/20/2023   RPRTITER 1:4 (H) 12/26/2022   RPRTITER 1:8 (H) 08/29/2022   RPRTITER 1:16 (H) 03/09/2022   RPRTITER 1:16 (H) 03/01/2021    STI Results GC GC CT CT  Latest Ref Rng & Units  NEGATIVE  NEGATIVE  05/08/2023  9:31 AM Negative    Negative   Negative    Positive    12/26/2022  2:57 PM Negative    Positive   Negative    Negative    03/01/2021  2:29 PM Negative    Negative    01/12/2021  3:38 PM Negative   Negative    05/25/2020  2:59 PM Positive   Positive    05/25/2020  2:58 PM Negative   Negative    09/17/2019  9:40 AM Negative   Negative    03/17/2018 12:00 AM Negative   Negative    02/18/2018 12:00 AM Negative   Negative    10/22/2016 12:00 AM Negative   Negative    08/08/2016 12:00 AM **POSITIVE**   Negative    05/14/2014 10:54 PM  POSITIVE   NEGATIVE     Hepatitis B Lab Results  Component Value Date   HEPBSAB NON-REACTIVE 12/26/2022   HEPBSAG NEGATIVE 05/20/2014   HEPBCAB NON REACTIVE 05/20/2014   Hepatitis C No results found for: "HEPCAB", "HCVRNAPCRQN" Hepatitis A Lab Results  Component Value Date   HAV REACTIVE (A) 12/26/2022   Lipids: Lab Results  Component Value Date   CHOL 273 (H) 09/20/2023   TRIG 461 (H) 09/20/2023   HDL 52 09/20/2023   CHOLHDL 5.3 (H) 09/20/2023   VLDL 67 (H) 10/22/2016   LDLCALC  09/20/2023     Comment:     . LDL cholesterol not calculated. Triglyceride levels greater than 400 mg/dL invalidate calculated LDL results. . Reference range: <100 . Desirable range <100 mg/dL for primary prevention;   <70 mg/dL for patients with CHD or diabetic patients  with > or = 2 CHD risk factors. Aaron Aas LDL-C is now calculated using the Martin-Hopkins  calculation, which is a validated novel method providing  better accuracy than the Friedewald equation in the  estimation of LDL-C.  Melinda Sprawls et al. Erroll Heard. 1610;960(45): 2061-2068  (http://education.[facility].com/faq/FAQ164)     Assessment: Yer presents today for syphilis treatment. He tested positive on 09/20/2023 with a titer of 1:16 and symptoms of burning on urination. Last documented RPR was 1:4 on 12/26/2022. Donell will need Bicillin  2.4 million units x 1. No known allergies to any antibiotics. Advised to take ibuprofen  or acetaminophen  today if needed for injection site pain.  Administered Bicillin  LA 1.2 million units in each upper outer  quadrant of the left and right gluteal muscle, totaling a dose of 2.4 million units. He tolerated well. Advised to abstain from sexual activity for 10 days and to ask partners to get tested and treated. Advised that the health department should be reaching out to contact trace. Answered all questions. Gershom will call with any issues.  At last visit on 09/20/2023, patient reported taking half a pill of Symtuza  when he was running low and unable to pickup his prescription from the pharmacy. HIV RNA at that visit was 297,0000. Switched from Symtuza  to Biktarvy  since he was out of medications, had lost Medicaid coverage, and required samples ([facility] not receiving Symtuza  samples at the time). Will check HIV genotype to assess for resistance mutations. Medicaid now active and Biktarvy  ready at [facility], but states he still has some samples so he has not picked it up yet.  Plan: - Administered Bicillin  LA 2.4 million units for syphilis diagnosis - HIV Genotype today - Call with any issues or questions  Kristopher Pheasant PharmD Candidate

## 2023-10-04 ENCOUNTER — Encounter

## 2023-10-11 LAB — HIV-1 GENOTYPE: HIV-1 Genotype: DETECTED — AB

## 2023-10-11 LAB — HIV RNA, RTPCR W/R GT (RTI, PI,INT)
HIV 1 RNA Quant: 989 {copies}/mL — ABNORMAL HIGH
HIV-1 RNA Quant, Log: 3 {Log_copies}/mL — ABNORMAL HIGH

## 2023-10-11 LAB — HIV-1 INTEGRASE GENOTYPE

## 2023-10-14 ENCOUNTER — Telehealth: Payer: Self-pay | Admitting: Pharmacist

## 2023-10-14 NOTE — Telephone Encounter (Signed)
 Shows you how strong Symtuza  is - even if split in half for a while! Glad it is clear.

## 2023-10-14 NOTE — Telephone Encounter (Signed)
 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   Interpretation of Genotype Data per [employer] HIV Drug Resistance Database:  Nucleoside RTIs  Abacavir - susceptible Zidovudine - susceptible Emtricitabine - susceptible Lamivudine - susceptible Tenofovir - susceptible   Non-Nucleoside RTIs  Doravirine - susceptible Efavirenz - high level resistance Etravirine - susceptible Nevirapine - high level resistance Rilpivirine - susceptible   Protease Inhibitors  Atazanavir - susceptible Darunavir - susceptible Lopinavir - susceptible   Integrase Inhibitors  Bictegravir - susceptible Cabotegravir - susceptible Dolutegravir - susceptible Elvitegravir - potential low level resistance Raltegravir - potential low level resistance   Patient is currently taking Biktarvy . No issues with genotype and continuation with current ART.  Reeanna Acri L. Kayton Dunaj, PharmD, BCIDP, AAHIVP, CPP Infectious Diseases Clinical Pharmacist Practitioner Clinical Pharmacist Lead, [facility] 10/14/2023, 3:47 PM

## 2023-12-24 NOTE — Progress Notes (Deleted)
   Chief complaint: follow-up for HIV disease    Subjective:    Patient ID: Seth Taylor, male    DOB: 1996-05-06, 28 y.o.   MRN: [id number]  HPI   Past Medical History:  Diagnosis Date   Chlamydia 01/12/2021   HIV disease (HCC)    Low back pain 12/19/2020   Marijuana use 12/19/2020   Mononucleosis    Syphilis     No past surgical history on file.  Family History  Problem Relation Age of Onset   CVA Mother        seconday to drug use   Diabetes Father    Hepatitis C Father       Social History   Socioeconomic History   Marital status: Single    Spouse name: Not on file   Number of children: Not on file   Years of education: Not on file   Highest education level: Not on file  Occupational History   Not on file  Tobacco Use   Smoking status: Former    Passive exposure: Never   Smokeless tobacco: Never  Substance and Sexual Activity   Alcohol use: Not Currently    Comment: on occassion   Drug use: Not Currently    Types: Marijuana    Comment: States last use approximately 1 month ago (03/09/22)   Sexual activity: Not Currently    Partners: Male    Birth control/protection: Condom    Comment: declined condoms  Other Topics Concern   Not on file  Social History Narrative   Not on file   Social Drivers of Health   Financial Resource Strain: Not on file  Food Insecurity: Not on file  Transportation Needs: Not on file  Physical Activity: Not on file  Stress: Not on file  Social Connections: Not on file    No Known Allergies   Current Outpatient Medications:    bictegravir-emtricitabine-tenofovir AF (BIKTARVY ) 50-200-25 MG TABS tablet, Take 1 whole tablet daily., Disp: 30 tablet, Rfl: 2   Review of Systems     Objective:   Physical Exam        Assessment & Plan:

## 2023-12-25 ENCOUNTER — Ambulatory Visit: Payer: Self-pay | Admitting: Infectious Disease

## 2023-12-25 DIAGNOSIS — Z113 Encounter for screening for infections with a predominantly sexual mode of transmission: Secondary | ICD-10-CM

## 2023-12-25 DIAGNOSIS — Z7185 Encounter for immunization safety counseling: Secondary | ICD-10-CM

## 2023-12-25 DIAGNOSIS — B20 Human immunodeficiency virus [HIV] disease: Secondary | ICD-10-CM

## 2023-12-25 DIAGNOSIS — I10 Essential (primary) hypertension: Secondary | ICD-10-CM

## 2023-12-31 ENCOUNTER — Telehealth: Payer: Self-pay

## 2023-12-31 DIAGNOSIS — B20 Human immunodeficiency virus [HIV] disease: Secondary | ICD-10-CM

## 2023-12-31 MED ORDER — BIKTARVY 50-200-25 MG PO TABS: ORAL_TABLET | ORAL | 0 refills | Status: DC

## 2023-12-31 NOTE — Telephone Encounter (Signed)
 Detectable Viral Load Intervention (DVL)  Most recent VL:  HIV 1 RNA Quant  Date Value Ref Range Status  10/02/2023 989 (H) copies/mL Final  09/20/2023 297,000 (H) NOT DETECTED copies/mL Final  05/08/2023 2,270 (H) Copies/mL Final    Last Clinic Visit: 10/02/23  Current ART regimen: Biktarvy   Appointment status: patient has future appointment scheduled  Medication last dispensed (per chart review):    Medication Adherence   What pharmacy do you use for your ART? [facility]  Do you pick up your medication at the pharmacy or is it mailed to you? pick up at pharmacy  How often do you miss a dose your ART? never or almost never  Are you experiencing any side effects with your ART? no  Are you having any trouble remembering what medication(s) you are supposed to take or how you are supposed to take them? no  What helps you remember to take your medication(s)? no   Barriers to Care   Lack of transportation to medical appointments? No- interested in assistance for gas  2. Housing instability? No    3. If you are currently employed, are you having difficulty taking time off of work for medical appointments? no  4. Financial concerns (rent, utilities, etc.) no  5. Lack of consistent access to food? no  6. Trouble remembering and attending your appointments? no  7. Are you experiencing any other barriers that make it hard for you to come to appointments or take medication regularly? no   Interventions   Called patient to discuss medication adherence and possible barriers to care.  Spoke with pt regarding detectable VL and medication adherence. He reports better adherence with ART. Denies missing any doses of Bik, but I do not see where this has been filled at [facility]. Did request refill be sent to [facility].  Reminded pt of upcoming appt. Will disucss THP at this visit if concerns arise.  Lorenda CHRISTELLA Code, RMA

## 2024-01-07 ENCOUNTER — Ambulatory Visit (INDEPENDENT_AMBULATORY_CARE_PROVIDER_SITE_OTHER): Admitting: Infectious Diseases

## 2024-01-07 ENCOUNTER — Other Ambulatory Visit (HOSPITAL_COMMUNITY)
Admission: RE | Admit: 2024-01-07 | Discharge: 2024-01-07 | Disposition: A | Discharge status: Home / Self Care | Source: Ambulatory Visit | Attending: Infectious Diseases | Admitting: Infectious Diseases

## 2024-01-07 ENCOUNTER — Encounter: Payer: Self-pay | Admitting: Infectious Diseases

## 2024-01-07 ENCOUNTER — Other Ambulatory Visit: Payer: Self-pay

## 2024-01-07 VITALS — BP 144/83 | HR 94 | Temp 98.4°F | Ht 71.0 in | Wt 350.0 lb

## 2024-01-07 DIAGNOSIS — B20 Human immunodeficiency virus [HIV] disease: Secondary | ICD-10-CM | POA: Diagnosis present

## 2024-01-07 DIAGNOSIS — Z113 Encounter for screening for infections with a predominantly sexual mode of transmission: Secondary | ICD-10-CM | POA: Diagnosis not present

## 2024-01-07 DIAGNOSIS — A539 Syphilis, unspecified: Secondary | ICD-10-CM

## 2024-01-07 DIAGNOSIS — Z23 Encounter for immunization: Secondary | ICD-10-CM

## 2024-01-07 NOTE — Progress Notes (Signed)
 Name: Seth Taylor  DOB: 1996-01-22 MRN: [id number] PCP: Patient, No Pcp Per    Brief Narrative:  Seth Taylor is a 28 y.o. 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  CD4 nadir 380 CO52,367 copies  Transmission Risk: MSM History of OIs: none History of STIs: syphilis  Hep B sAg (-), sAb (vaccinated 2015), cAb (); Hep A (), Hep C (- 2015) Quantiferon (- 2015) HLA B*5701 () G6PD: () MMR titer: pending    Previous Regimens: Genvoya , 2015 Symtuza  in 2022 with poor adherence Biktarvy  2025  Genotypes: Cumulative 2025 - K103N, E157Q (EVG, RAL)  Subjective  Chief Complaint  Patient presents with   Follow-up     Discussed the use of AI scribe software for clinical note transcription with the patient, who gave verbal consent to proceed.  History of Present Illness   Seth Taylor is a 28 year old male with HIV who presents for follow-up care after a medication switch to Biktarvy . He is accompanied by his cousin.  He was switched from Symtuza  to Biktarvy  in April due to a lapse in medication coverage by insurance. Since the switch, he has not experienced significant side effects. His viral load was initially 297,000 on April 11th, which decreased to 989 after resuming medication. A repeat genotype showed no resistance.  In April, he was treated for a new syphilis infection with a titer of 1:16 with one round of Bicillin .   He has experienced swelling in his ankles and back pain, which led him to visit urgent care and his primary care provider. The swelling has since resolved. He has gained a significant amount of weight over the past six months, which he attributes to changes in his eating habits and possibly the medication switch. He is monitoring his weight and has been referred to an orthopedic specialist for his back pain.  He is due for his final dose of the HPV vaccine, having received two doses as a teenager.          05/08/2023    9:22 AM  Depression screen PHQ 2/9   Decreased Interest 0  Down, Depressed, Hopeless 0  PHQ - 2 Score 0    Review of Systems  Constitutional:  Negative for chills and fever.  HENT:  Negative for tinnitus.   Eyes:  Negative for blurred vision and photophobia.  Respiratory:  Negative for cough and sputum production.   Cardiovascular:  Negative for chest pain.  Gastrointestinal:  Negative for diarrhea, nausea and vomiting.  Genitourinary:  Negative for dysuria.  Skin:  Negative for rash.  Neurological:  Negative for headaches.    Past Medical History:  Diagnosis Date   Chlamydia 01/12/2021   HIV disease (HCC)    Low back pain 12/19/2020   Marijuana use 12/19/2020   Mononucleosis    Syphilis     Outpatient Medications Prior to Visit  Medication Sig Dispense Refill   bictegravir-emtricitabine-tenofovir AF (BIKTARVY ) 50-200-25 MG TABS tablet Take 1 whole tablet daily. 30 tablet 0   No facility-administered medications prior to visit.     No Known Allergies  Social History   Tobacco Use   Smoking status: Former    Passive exposure: Never   Smokeless tobacco: Never  Substance Use Topics   Alcohol use: Not Currently    Comment: on occassion   Drug use: Not Currently    Types: Marijuana    Comment: States last use approximately 1 month ago (03/09/22)    Family History  Problem Relation  Age of Onset   CVA Mother        seconday to drug use   Diabetes Father    Hepatitis C Father     Social History   Substance and Sexual Activity  Sexual Activity Not Currently   Partners: Male   Birth control/protection: Condom   Comment: declined condoms        Objective  Vitals:   01/07/24 1602  BP: (!) 144/83  Pulse: 94  Temp: 98.4 F (36.9 C)  TempSrc: Oral  SpO2: 93%  Weight: (!) 350 lb (158.8 kg)  Height: 5' 11 (1.803 m)   Body mass index is 48.82 kg/m.  Physical Exam Constitutional:      Appearance: Normal appearance. He is not ill-appearing.  HENT:     Head: Normocephalic.      Mouth/Throat:     Mouth: Mucous membranes are moist.     Pharynx: Oropharynx is clear.  Eyes:     General: No scleral icterus. Cardiovascular:     Rate and Rhythm: Normal rate.  Pulmonary:     Effort: Pulmonary effort is normal.  Musculoskeletal:        General: Normal range of motion.     Cervical back: Normal range of motion.  Skin:    Coloration: Skin is not jaundiced or pale.  Neurological:     Mental Status: He is alert and oriented to person, place, and time.  Psychiatric:        Mood and Affect: Mood normal.        Judgment: Judgment normal.         Assessment and Plan    HIV infection -  HIV infection now managed on Biktarvy  after switching from Symtuza  in April of this year. Viral load decreased from 297,000 to 989. Genotype was wildtype aside from K103N. 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  No side effects reported from Biktarvy . Discussed potential for weight gain and increased appetite as side effects, but switching medications may not guarantee weight loss. - Send in refills for Biktarvy  - Recheck viral load to ensure continued suppression  Syphilis Recently treated syphilis with a titer of 1:16 in April. Monitoring for cure is necessary to ensure successful treatment. - Recheck syphilis levels to confirm successful treatment  Weight gain with associated back pain -  01/07/24 (!) 350 lb (158.8 kg) 05/08/23    (!) 320 lb (145.2 kg) 01/25/23 (!) 300 lb 3.2 oz (136.2 kg)  Significant weight gain 50# that has been since August 2024 -  This could potentially contributing to back pain. Discussed possibility of weight gain being related to Biktarvy  though it seemed to have started before. He went to urgent care and realized he was pre-diabetic with A1C 5.9%. BP is borderline. No edema present on exam today so does not seem to be contributing to weight increase. Encouraged him to get a scale at home to monitor trend. Can  consider switch to dovato or back to symtuza  to see if that prevents further game.  - Encourage monitoring weight at home with a scale - Follow up with orthopedic specialist for back pain evaluation  Prediabetes -  Recently identified as prediabetic. Discussed importance of monitoring blood sugar levels and potential impact of medications on blood sugar.   May benefit from addition of metformin to help with weight and glycemic control.  Would do well with routine primary care - [facility] but I think  the scope of what care they offer may need higher level.   Incomplete HPV vaccination series -  Incomplete HPV vaccination series with only two doses received. Requires a third dose to complete the series. - Administer final dose of HPV vaccine       Orders Placed This Encounter  Procedures   HPV 9-valent vaccine,Recombinat   RPR   T-helper cells (CD4) count   HIV 1 RNA quant-no reflex-bld    No orders of the defined types were placed in this encounter.   Return in about 3 months (around 04/08/2024).   Corean Fireman, MSN, NP-C [facility] for Infectious Disease [facility]  [email or web address] Pager: [phone] Office: [phone] [facility] Main Line: [phone] *Secure Chat Communication Welcome

## 2024-01-07 NOTE — Patient Instructions (Signed)
 If you can pick up a cheap scale to keep in your bathroom to weigh your self 1-2 times a week to keep attention on your weight trend.   For some we can see a increase in weight and appetite on Biktarvy  - let's check in again in 3 months

## 2024-01-09 LAB — CYTOLOGY, (ORAL, ANAL, URETHRAL) ANCILLARY ONLY
Chlamydia: NEGATIVE
Chlamydia: NEGATIVE
Comment: NEGATIVE
Comment: NEGATIVE
Comment: NORMAL
Comment: NORMAL
Neisseria Gonorrhea: NEGATIVE
Neisseria Gonorrhea: NEGATIVE

## 2024-01-09 LAB — URINE CYTOLOGY ANCILLARY ONLY
Chlamydia: NEGATIVE
Comment: NEGATIVE
Comment: NORMAL
Neisseria Gonorrhea: NEGATIVE

## 2024-01-09 LAB — T-HELPER CELLS (CD4) COUNT (NOT AT ARMC)
CD4 % Helper T Cell: 22 % — ABNORMAL LOW (ref 33–65)
CD4 T Cell Abs: 345 /uL — ABNORMAL LOW (ref 400–1790)

## 2024-01-10 ENCOUNTER — Ambulatory Visit: Payer: Self-pay | Admitting: Infectious Diseases

## 2024-01-11 LAB — HIV-1 RNA QUANT-NO REFLEX-BLD
HIV 1 RNA Quant: 118000 {copies}/mL — ABNORMAL HIGH
HIV-1 RNA Quant, Log: 5.07 {Log_copies}/mL — ABNORMAL HIGH

## 2024-01-11 LAB — RPR: RPR Ser Ql: REACTIVE — AB

## 2024-01-11 LAB — T PALLIDUM AB: T Pallidum Abs: POSITIVE — AB

## 2024-01-11 LAB — RPR TITER: RPR Titer: 1:4 {titer} — ABNORMAL HIGH

## 2024-02-25 ENCOUNTER — Encounter (HOSPITAL_COMMUNITY): Payer: Self-pay

## 2024-02-25 ENCOUNTER — Ambulatory Visit (HOSPITAL_COMMUNITY)
Admission: EM | Admit: 2024-02-25 | Discharge: 2024-02-25 | Disposition: A | Discharge status: Home / Self Care | Attending: Family Medicine | Admitting: Family Medicine

## 2024-02-25 DIAGNOSIS — Z113 Encounter for screening for infections with a predominantly sexual mode of transmission: Secondary | ICD-10-CM

## 2024-02-25 DIAGNOSIS — Z7252 High risk homosexual behavior: Secondary | ICD-10-CM | POA: Diagnosis not present

## 2024-02-25 DIAGNOSIS — K6289 Other specified diseases of anus and rectum: Secondary | ICD-10-CM

## 2024-02-25 MED ORDER — HYDROCODONE-ACETAMINOPHEN 5-325 MG PO TABS: 1.0000 | ORAL_TABLET | Freq: Four times a day (QID) | ORAL | 0 refills | Status: DC | PRN

## 2024-02-25 MED ORDER — NIFEDIPINE POWD: 0 refills | Status: DC

## 2024-02-25 NOTE — ED Triage Notes (Signed)
 Pt states rectal pain and bleeding after having anal sex on 02/17/2024.  States he has been taking Advil  at home for the pain. Also states he would like to be tested for STD's

## 2024-02-25 NOTE — Discharge Instructions (Addendum)
 Be aware, you have been prescribed pain medications that may cause drowsiness. While taking this medication, do not take any other medications containing acetaminophen  (Tylenol ). Do not combine with alcohol or recreational drugs. Please do not drive, operate heavy machinery, or take part in activities that require making important decisions while on this medication as your judgement may be clouded.  I recommend that you take a stool softener while taking the pain medicine; MIRALAX is one you can purchase over the counter.  We have sent testing for sexually transmitted infections (gonorrhea/chlamydia/syphilis). We will notify you of any positive results once they are received. If required, we will prescribe any medications you might need.  Please refrain from all sexual activity for at least the next seven days.

## 2024-02-25 NOTE — ED Provider Notes (Signed)
 [facility]   [id number] 02/25/24 Arrival Time: 1400  ASSESSMENT & PLAN:  1. Rectal pain   2. High risk homosexual behavior   3. Screening for STDs (sexually transmitted diseases)    Limited rectal exam; he just cannot take the pain; I did not see any sign of rectal abscess. Suspect rectal tear/anal fissure. Discussed.  Meds ordered this encounter  Medications   HYDROcodone -acetaminophen  (NORCO/VICODIN) 5-325 MG tablet    Sig: Take 1 tablet by mouth every 6 (six) hours as needed for moderate pain (pain score 4-6) or severe pain (pain score 7-10).    Dispense:  12 tablet    Refill:  0   DISCONTD: NIFEdipine  POWD    Sig: Please compound into 0.2% ointment to be applied to anal area tid.    Dispense:  250 g    Refill:  0   NIFEdipine  POWD    Sig: Please compound into 0.2% ointment to be applied to anal area tid.    Dispense:  250 g    Refill:  0     Discharge Instructions      Be aware, you have been prescribed pain medications that may cause drowsiness. While taking this medication, do not take any other medications containing acetaminophen  (Tylenol ). Do not combine with alcohol or recreational drugs. Please do not drive, operate heavy machinery, or take part in activities that require making important decisions while on this medication as your judgement may be clouded.  I recommend that you take a stool softener while taking the pain medicine; MIRALAX is one you can purchase over the counter.  We have sent testing for sexually transmitted infections (gonorrhea/chlamydia/syphilis). We will notify you of any positive results once they are received. If required, we will prescribe any medications you might need.  Please refrain from all sexual activity for at least the next seven days.     Orders Placed This Encounter     Ambulatory referral to General Surgery    Referral Priority:   Urgent    Referral Type:   Surgical    Referral Reason:   Specialty Services  Required    Referred to Provider:   Stechschulte, Deward PARAS, MD    Requested Specialty:   General Surgery    Number of Visits Requested:   1     Reviewed expectations re: course of current medical issues. Questions answered. Outlined signs and symptoms indicating need for more acute intervention. Patient verbalized understanding. After Visit Summary given.   SUBJECTIVE: History from: patient. Seth Taylor is a 28 y.o. male with who presents with complaint of rectal pain s/p receptive anal intercourse approx 6 days ago. Persistent pain with occas 'very sharp' pain at rectum. Moving bowels normally; initially noted some BRB but this has resolved. OTC analgesics without relief. Desires STD testing.  History reviewed. No pertinent surgical history.   OBJECTIVE:  Vitals:   02/25/24 1440  BP: (!) 136/100  Pulse: (!) 104  Resp: 20  Temp: 98.5 F (36.9 C)  TempSrc: Oral  SpO2: 96%    General appearance: alert, oriented, no acute distress but appears to be in pain Abdomen: obese Rectal: very limited exam; I can visualize the rectum but he cannot tolerate even the slightest touch; without active bleeding; without swelling Skin: warm and dry Psychological: alert and cooperative; normal mood and affect  Labs:  Labs Reviewed  RPR  ANAL CYTOLOGY    Imaging: No results found.   No Known Allergies  Past Medical History:  Diagnosis Date   Chlamydia 01/12/2021   HIV disease (HCC)    Low back pain 12/19/2020   Marijuana use 12/19/2020   Mononucleosis    Syphilis     Social History   Socioeconomic History   Marital status: Single    Spouse name: Not on file   Number of children: Not on file   Years of education: Not on file   Highest education level: Not on file  Occupational History   Not on file  Tobacco Use   Smoking status: Former    Passive exposure: Never   Smokeless tobacco: Never  Substance and Sexual Activity    Alcohol use: Not Currently    Comment: on occassion   Drug use: Not Currently    Types: Marijuana    Comment: States last use approximately 1 month ago (03/09/22)   Sexual activity: Not Currently    Partners: Male    Birth control/protection: Condom    Comment: declined condoms  Other Topics Concern   Not on file  Social History Narrative   Not on file   Social Drivers of Health   Financial Resource Strain: Not on file  Food Insecurity: Not on file  Transportation Needs: Not on file  Physical Activity: Not on file  Stress: Not on file  Social Connections: Not on file  Intimate Partner Violence: Not on file    Family History  Problem Relation Age of Onset   CVA Mother        seconday to drug use   Diabetes Father    Hepatitis C Father      Rolinda Rogue, MD 02/25/24 [phone]

## 2024-02-26 ENCOUNTER — Other Ambulatory Visit: Payer: Self-pay

## 2024-02-26 ENCOUNTER — Encounter (HOSPITAL_COMMUNITY): Payer: Self-pay

## 2024-02-26 ENCOUNTER — Emergency Department (HOSPITAL_COMMUNITY)
Admission: EM | Admit: 2024-02-26 | Discharge: 2024-02-26 | Discharge status: Against Medical Advice | Attending: Emergency Medicine | Admitting: Emergency Medicine

## 2024-02-26 DIAGNOSIS — Z5321 Procedure and treatment not carried out due to patient leaving prior to being seen by health care provider: Secondary | ICD-10-CM | POA: Insufficient documentation

## 2024-02-26 DIAGNOSIS — K625 Hemorrhage of anus and rectum: Secondary | ICD-10-CM | POA: Diagnosis present

## 2024-02-26 LAB — RPR
RPR Ser Ql: REACTIVE — AB
RPR Titer: 1:8 {titer}

## 2024-02-26 LAB — CYTOLOGY, (ORAL, ANAL, URETHRAL) ANCILLARY ONLY
Chlamydia: NEGATIVE
Comment: NEGATIVE
Comment: NORMAL
Neisseria Gonorrhea: NEGATIVE

## 2024-02-26 NOTE — ED Triage Notes (Signed)
 BIB EMS from home for rectal pain and bleeding after anal intercourse. Pt seen at [facility] and told to take stool softeners and pt had to have BM and felt like he was going to pass out when having BM, pt did not pass out

## 2024-02-27 LAB — T.PALLIDUM AB, TOTAL: T Pallidum Abs: REACTIVE — AB

## 2024-03-02 ENCOUNTER — Ambulatory Visit (HOSPITAL_COMMUNITY): Payer: Self-pay

## 2024-04-01 ENCOUNTER — Ambulatory Visit: Admitting: Infectious Diseases

## 2024-04-27 ENCOUNTER — Ambulatory Visit: Admitting: Infectious Diseases

## 2024-05-05 ENCOUNTER — Encounter: Payer: Self-pay | Admitting: Family

## 2024-05-05 ENCOUNTER — Ambulatory Visit: Admitting: Family

## 2024-05-05 ENCOUNTER — Other Ambulatory Visit (HOSPITAL_COMMUNITY): Payer: Self-pay

## 2024-05-05 ENCOUNTER — Other Ambulatory Visit: Payer: Self-pay

## 2024-05-05 ENCOUNTER — Other Ambulatory Visit (HOSPITAL_COMMUNITY)
Admission: RE | Admit: 2024-05-05 | Discharge: 2024-05-05 | Disposition: A | Discharge status: Home / Self Care | Source: Ambulatory Visit | Attending: Family | Admitting: Family

## 2024-05-05 VITALS — BP 125/81 | HR 92 | Temp 98.9°F | Wt 344.0 lb

## 2024-05-05 DIAGNOSIS — Z0000 Encounter for general adult medical examination without abnormal findings: Secondary | ICD-10-CM | POA: Insufficient documentation

## 2024-05-05 DIAGNOSIS — Z113 Encounter for screening for infections with a predominantly sexual mode of transmission: Secondary | ICD-10-CM | POA: Diagnosis not present

## 2024-05-05 DIAGNOSIS — B20 Human immunodeficiency virus [HIV] disease: Secondary | ICD-10-CM

## 2024-05-05 DIAGNOSIS — Z79899 Other long term (current) drug therapy: Secondary | ICD-10-CM

## 2024-05-05 MED ORDER — BIKTARVY 50-200-25 MG PO TABS: ORAL_TABLET | ORAL | 5 refills | Status: AC

## 2024-05-05 NOTE — Assessment & Plan Note (Signed)
 Seth Taylor reports good adherence and tolerance to Biktarvy  although refill history does not support with last medication refill on 01/01/2024.  Discussed importance of taking medication daily as prescribed.  Reviewed previous lab work and U equals U.  Covered by Medicaid and should have no problems obtaining medication from the pharmacy.  Social determinants of health reviewed with no interventions indicated.  Sample of Biktarvy  provided.  Check blood work.  Continue current dose of Biktarvy .  Plan for follow-up in 3 months or sooner if needed with lab work on the same day.

## 2024-05-05 NOTE — Assessment & Plan Note (Signed)
 Discussed importance of safe sexual practice and condom use. Condoms and site specific STD testing offered.  Vaccinations reviewed and following counseling declined Due for routine dental care with referral to [facility] placed.

## 2024-05-05 NOTE — Patient Instructions (Signed)
 Nice to see you.  We will check your lab work today.  Continue to take your medication daily as prescribed.  Refills have been sent to the pharmacy.  Plan for follow up in 3 months or sooner if needed with lab work on the same day.  Have a great day and stay safe!

## 2024-05-05 NOTE — Progress Notes (Signed)
 Brief Narrative   Patient ID: Seth Taylor, male    DOB: [birth year], 28 y.o.   MRN: [id number]  Seth Taylor is a 28 y.o. 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  CD4 nadir 380 CO52,367 copies  Transmission Risk: MSM History of OIs: none History of STIs: syphilis  Hep B sAg (-), sAb (vaccinated 2015), cAb (); Hep A (), Hep C (- 2015) Quantiferon (- 2015) HLA B*5701 () G6PD: () MMR titer: pending      Previous Regimens: Genvoya , 2015 Symtuza  in 2022 with poor adherence Biktarvy  2025   Genotypes: Cumulative 2025 - K103N, E157Q (EVG, RAL)  Subjective:   Chief Complaint  Patient presents with   Follow-up    HPI:  Seth Taylor is a 28 y.o. male with HIV disease last seen on 01/07/2024 by Corean Fireman, NP with poorly controlled virus.  Viral load was 118,000 with CD4 count of 345.  RPR titer was positive at 1: 4 with no evidence of new infection or indications for treatment.  Here today for routine follow-up.  Seth Taylor has been doing okay since his last office visit and continues to take Biktarvy  as prescribed with no adverse side effects or problems obtaining medication from the pharmacy.  Covered by Medicaid.  Working full-time with stable housing, transportation, food.  Having some dental pain and requesting referral to dental care.  Healthcare maintenance reviewed.  Condoms and site-specific STD testing offered.  Denies fevers, chills, night sweats, headaches, changes in vision, neck pain/stiffness, nausea, diarrhea, vomiting, lesions or rashes.  Lab Results  Component Value Date   CD4TCELL 22 (L) 01/07/2024   CD4TABS 345 (L) 01/07/2024   Lab Results  Component Value Date   HIV1RNAQUANT 118,000 (H) 01/07/2024     No Known Allergies    Outpatient Medications Prior to Visit  Medication Sig Dispense Refill   bictegravir-emtricitabine-tenofovir AF (BIKTARVY ) 50-200-25 MG TABS tablet Take 1 whole tablet daily. 30 tablet 0   famotidine  (PEPCID ) 20 MG  tablet Take 1 tablet (20 mg total) by mouth 2 (two) times daily. (Patient not taking: Reported on 07/18/2018) 14 tablet 0   HYDROcodone -acetaminophen  (NORCO/VICODIN) 5-325 MG tablet Take 1 tablet by mouth every 6 (six) hours as needed for moderate pain (pain score 4-6) or severe pain (pain score 7-10). (Patient not taking: Reported on 05/05/2024) 12 tablet 0   NIFEdipine  POWD Please compound into 0.2% ointment to be applied to anal area tid. (Patient not taking: Reported on 05/05/2024) 250 g 0   No facility-administered medications prior to visit.     Past Medical History:  Diagnosis Date   Chlamydia 01/12/2021   HIV disease (HCC)    Low back pain 12/19/2020   Marijuana use 12/19/2020   Mononucleosis    Syphilis      History reviewed. No pertinent surgical history.      Review of Systems  Constitutional:  Negative for appetite change, chills, fatigue, fever and unexpected weight change.  Eyes:  Negative for visual disturbance.  Respiratory:  Negative for cough, chest tightness, shortness of breath and wheezing.   Cardiovascular:  Negative for chest pain and leg swelling.  Gastrointestinal:  Negative for abdominal pain, constipation, diarrhea, nausea and vomiting.  Genitourinary:  Negative for dysuria, flank pain, frequency, genital sores, hematuria and urgency.  Skin:  Negative for rash.  Allergic/Immunologic: Negative for immunocompromised state.  Neurological:  Negative for dizziness and headaches.     Objective:   BP 125/81   Pulse 92  Temp 98.9 F (37.2 C) (Oral)   Wt (!) 344 lb (156 kg)   SpO2 92%   BMI 47.98 kg/m  Nursing note and vital signs reviewed.  Physical Exam Constitutional:      General: He is not in acute distress.    Appearance: He is well-developed. He is obese.  Eyes:     Conjunctiva/sclera: Conjunctivae normal.  Cardiovascular:     Rate and Rhythm: Normal rate and regular rhythm.     Heart sounds: Normal heart sounds. No murmur heard.    No  friction rub. No gallop.  Pulmonary:     Effort: Pulmonary effort is normal. No respiratory distress.     Breath sounds: Normal breath sounds. No wheezing or rales.  Chest:     Chest wall: No tenderness.  Abdominal:     General: Bowel sounds are normal.     Palpations: Abdomen is soft.     Tenderness: There is no abdominal tenderness.  Musculoskeletal:     Cervical back: Neck supple.  Lymphadenopathy:     Cervical: No cervical adenopathy.  Skin:    General: Skin is warm and dry.     Findings: No rash.  Neurological:     Mental Status: He is alert and oriented to person, place, and time.          05/08/2023    9:22 AM 01/25/2023   11:04 AM 08/29/2022   10:36 AM 03/09/2022   10:07 AM 04/27/2021   10:19 AM  Depression screen PHQ 2/9  Decreased Interest 0 0 0 0 0  Down, Depressed, Hopeless 0 0 0 0 0  PHQ - 2 Score 0 0 0 0 0         No data to display             Assessment & Plan:    Patient Active Problem List   Diagnosis Date Noted   Healthcare maintenance 05/05/2024   High risk sexual behavior 01/25/2023   Medication monitoring encounter 01/25/2023   Vaccine counseling 01/25/2023   Routine screening for STI (sexually transmitted infection) 03/09/2022   Chlamydia 01/12/2021   Low back pain 12/19/2020   Marijuana use 12/19/2020   HTN (hypertension) 05/10/2020   Rectal lesion 05/10/2020   Morbid obesity (HCC) 03/31/2018   HIV disease (HCC) 07/08/2014   Syphilis 07/08/2014   Gonorrhea 07/08/2014   Hypertriglyceridemia 07/08/2014     Problem List Items Addressed This Visit       Other   HIV disease (HCC) - Primary   Seth Taylor reports good adherence and tolerance to Biktarvy  although refill history does not support with last medication refill on 01/01/2024.  Discussed importance of taking medication daily as prescribed.  Reviewed previous lab work and U equals U.  Covered by Medicaid and should have no problems obtaining medication from the pharmacy.   Social determinants of health reviewed with no interventions indicated.  Sample of Biktarvy  provided.  Check blood work.  Continue current dose of Biktarvy .  Plan for follow-up in 3 months or sooner if needed with lab work on the same day.      Relevant Medications   bictegravir-emtricitabine-tenofovir AF (BIKTARVY ) 50-200-25 MG TABS tablet   Other Relevant Orders   Comprehensive metabolic panel with GFR   HIV-1 RNA quant-no reflex-bld   T-helper cell (CD4)- ([facility] clinic only)   Healthcare maintenance   Discussed importance of safe sexual practice and condom use. Condoms and site specific STD testing offered.  Vaccinations reviewed  and following counseling declined Due for routine dental care with referral to [facility] placed.       Other Visit Diagnoses       Screening for STDs (sexually transmitted diseases)       Relevant Orders   Urine cytology ancillary only   RPR W/RFLX TO RPR TITER, TREPONEMAL AB, SCREEN AND DIAGNOSIS   Cytology (oral, anal, urethral) ancillary only        I have discontinued Seth Taylor's famotidine , HYDROcodone -acetaminophen , and NIFEdipine . I am also having him maintain his Biktarvy .   Meds ordered this encounter  Medications   bictegravir-emtricitabine-tenofovir AF (BIKTARVY ) 50-200-25 MG TABS tablet    Sig: Take 1 whole tablet daily.    Dispense:  30 tablet    Refill:  5    Supervising Provider:   SNIDER, CYNTHIA [phone]    Prescription Type::   Renewal     Follow-up: Return in about 3 months (around 08/05/2024). or sooner if needed.    Cathlyn July, MSN, FNP-C Nurse Practitioner [facility] for Infectious Disease [facility] Main number: [phone]

## 2024-05-06 LAB — CYTOLOGY, (ORAL, ANAL, URETHRAL) ANCILLARY ONLY
Chlamydia: NEGATIVE
Comment: NEGATIVE
Comment: NORMAL
Neisseria Gonorrhea: NEGATIVE

## 2024-05-06 LAB — URINE CYTOLOGY ANCILLARY ONLY
Chlamydia: NEGATIVE
Comment: NEGATIVE
Comment: NORMAL
Neisseria Gonorrhea: NEGATIVE

## 2024-05-08 LAB — COMPREHENSIVE METABOLIC PANEL WITH GFR
AG Ratio: 1.3 (calc) (ref 1.0–2.5)
ALT: 24 U/L (ref 9–46)
AST: 22 U/L (ref 10–40)
Albumin: 4.4 g/dL (ref 3.6–5.1)
Alkaline phosphatase (APISO): 76 U/L (ref 36–130)
BUN: 10 mg/dL (ref 7–25)
CO2: 29 mmol/L (ref 20–32)
Calcium: 9.2 mg/dL (ref 8.6–10.3)
Chloride: 103 mmol/L (ref 98–110)
Creat: 1.02 mg/dL (ref 0.60–1.24)
Globulin: 3.5 g/dL (ref 1.9–3.7)
Glucose, Bld: 87 mg/dL (ref 65–99)
Potassium: 3.9 mmol/L (ref 3.5–5.3)
Sodium: 139 mmol/L (ref 135–146)
Total Bilirubin: 0.5 mg/dL (ref 0.2–1.2)
Total Protein: 7.9 g/dL (ref 6.1–8.1)
eGFR: 103 mL/min/1.73m2 (ref >=60)

## 2024-05-08 LAB — T PALLIDUM AB: T Pallidum Abs: POSITIVE — AB

## 2024-05-08 LAB — SYPHILIS: RPR W/REFLEX TO RPR TITER AND TREPONEMAL ANTIBODIES, TRADITIONAL SCREENING AND DIAGNOSIS ALGORITHM: RPR Ser Ql: REACTIVE — AB

## 2024-05-08 LAB — HIV-1 RNA QUANT-NO REFLEX-BLD
HIV 1 RNA Quant: <20 {copies}/mL — AB
HIV-1 RNA Quant, Log: <1.3 {Log_copies}/mL — AB

## 2024-05-08 LAB — T-HELPER CELL (CD4) - (RCID CLINIC ONLY)
CD4 % Helper T Cell: 23 % — ABNORMAL LOW (ref 33–65)
CD4 T Cell Abs: 402 /uL (ref 400–1790)

## 2024-05-08 LAB — RPR TITER: RPR Titer: 1:8 {titer} — ABNORMAL HIGH

## 2024-05-11 ENCOUNTER — Ambulatory Visit: Payer: Self-pay | Admitting: Family

## 2024-05-11 ENCOUNTER — Telehealth: Payer: Self-pay

## 2024-05-11 NOTE — Telephone Encounter (Signed)
 Patient called to follow up on lab results from last appointment. Reviewed HIV viral load, Cd4, and cytology results. Will forward message to provider to advise on RPR titer. Pt requesting follow up call once reviewed. Lorenda CHRISTELLA Code, RMA

## 2024-05-12 NOTE — Telephone Encounter (Signed)
 Attempted to call patient back to review RPR titer. Per Philemon, FNP He was previously treated at 1:16 then went down to 1:4. Likely testing and not new infection  Lorenda CHRISTELLA Code, RMA

## 2024-05-13 ENCOUNTER — Other Ambulatory Visit: Payer: Self-pay | Admitting: Pharmacist

## 2024-05-13 DIAGNOSIS — B20 Human immunodeficiency virus [HIV] disease: Secondary | ICD-10-CM

## 2024-05-13 MED ORDER — BIKTARVY 50-200-25 MG PO TABS: 1.0000 | ORAL_TABLET | Freq: Every day | ORAL | Status: AC

## 2024-05-13 NOTE — Progress Notes (Signed)
 Medication Samples have been provided to the patient.  Drug name: Biktarvy         Strength: 50/200/25 mg       Qty: 1 bottle (7 tablets)   LOT: [id number]   Exp.Date: 07/11/26  Samples requested by Cathlyn July, NP.  Dosing instructions: Take one tablet by mouth once daily  The patient has been instructed regarding the correct time, dose, and frequency of taking this medication, including desired effects and most common side effects.   Maysa Lynn L. Joslynne Klatt, PharmD, BCIDP, AAHIVP, CPP Clinical Pharmacist Practitioner Infectious Diseases Clinical Pharmacist [facility]

## 2024-07-20 ENCOUNTER — Ambulatory Visit: Admitting: Infectious Diseases
